# Patient Record
Sex: Female | Born: 1955 | Race: White | Hispanic: No | Marital: Married | Smoking: Former smoker
Health system: Southern US, Community
[De-identification: ages and names within clinical notes are randomized; demographics above are authoritative.]

## PROBLEM LIST (undated history)

## (undated) DIAGNOSIS — H3530 Unspecified macular degeneration: Secondary | ICD-10-CM

## (undated) DIAGNOSIS — C801 Malignant (primary) neoplasm, unspecified: Secondary | ICD-10-CM

## (undated) HISTORY — DX: Malignant (primary) neoplasm, unspecified: C80.1

## (undated) HISTORY — PX: CARPAL TUNNEL RELEASE: SHX101

## (undated) HISTORY — DX: Unspecified macular degeneration: H35.30

---

## 2011-04-07 ENCOUNTER — Ambulatory Visit: Payer: Self-pay | Admitting: General Practice

## 2019-01-30 DIAGNOSIS — R69 Illness, unspecified: Secondary | ICD-10-CM | POA: Diagnosis not present

## 2019-03-20 DIAGNOSIS — R69 Illness, unspecified: Secondary | ICD-10-CM | POA: Diagnosis not present

## 2019-05-02 DIAGNOSIS — H5200 Hypermetropia, unspecified eye: Secondary | ICD-10-CM | POA: Diagnosis not present

## 2019-05-02 DIAGNOSIS — H2513 Age-related nuclear cataract, bilateral: Secondary | ICD-10-CM | POA: Diagnosis not present

## 2019-05-06 DIAGNOSIS — Z0100 Encounter for examination of eyes and vision without abnormal findings: Secondary | ICD-10-CM | POA: Diagnosis not present

## 2019-06-04 DIAGNOSIS — L821 Other seborrheic keratosis: Secondary | ICD-10-CM | POA: Diagnosis not present

## 2019-06-04 DIAGNOSIS — L570 Actinic keratosis: Secondary | ICD-10-CM | POA: Diagnosis not present

## 2019-06-04 DIAGNOSIS — D225 Melanocytic nevi of trunk: Secondary | ICD-10-CM | POA: Diagnosis not present

## 2019-06-04 DIAGNOSIS — L814 Other melanin hyperpigmentation: Secondary | ICD-10-CM | POA: Diagnosis not present

## 2019-06-04 DIAGNOSIS — D485 Neoplasm of uncertain behavior of skin: Secondary | ICD-10-CM | POA: Diagnosis not present

## 2019-06-04 DIAGNOSIS — L905 Scar conditions and fibrosis of skin: Secondary | ICD-10-CM | POA: Diagnosis not present

## 2019-06-04 DIAGNOSIS — Z85828 Personal history of other malignant neoplasm of skin: Secondary | ICD-10-CM | POA: Diagnosis not present

## 2019-06-04 DIAGNOSIS — D22122 Melanocytic nevi of left lower eyelid, including canthus: Secondary | ICD-10-CM | POA: Diagnosis not present

## 2019-08-01 DIAGNOSIS — R69 Illness, unspecified: Secondary | ICD-10-CM | POA: Diagnosis not present

## 2019-09-03 DIAGNOSIS — L810 Postinflammatory hyperpigmentation: Secondary | ICD-10-CM | POA: Diagnosis not present

## 2019-09-03 DIAGNOSIS — L820 Inflamed seborrheic keratosis: Secondary | ICD-10-CM | POA: Diagnosis not present

## 2019-10-02 DIAGNOSIS — Z20822 Contact with and (suspected) exposure to covid-19: Secondary | ICD-10-CM | POA: Diagnosis not present

## 2019-10-02 DIAGNOSIS — Z03818 Encounter for observation for suspected exposure to other biological agents ruled out: Secondary | ICD-10-CM | POA: Diagnosis not present

## 2020-01-04 DIAGNOSIS — L820 Inflamed seborrheic keratosis: Secondary | ICD-10-CM | POA: Diagnosis not present

## 2020-01-04 DIAGNOSIS — D1801 Hemangioma of skin and subcutaneous tissue: Secondary | ICD-10-CM | POA: Diagnosis not present

## 2020-01-04 DIAGNOSIS — L814 Other melanin hyperpigmentation: Secondary | ICD-10-CM | POA: Diagnosis not present

## 2020-01-04 DIAGNOSIS — D229 Melanocytic nevi, unspecified: Secondary | ICD-10-CM | POA: Diagnosis not present

## 2020-01-04 DIAGNOSIS — L821 Other seborrheic keratosis: Secondary | ICD-10-CM | POA: Diagnosis not present

## 2020-01-04 DIAGNOSIS — L918 Other hypertrophic disorders of the skin: Secondary | ICD-10-CM | POA: Diagnosis not present

## 2020-01-04 DIAGNOSIS — D485 Neoplasm of uncertain behavior of skin: Secondary | ICD-10-CM | POA: Diagnosis not present

## 2020-01-04 DIAGNOSIS — D2262 Melanocytic nevi of left upper limb, including shoulder: Secondary | ICD-10-CM | POA: Diagnosis not present

## 2020-02-11 DIAGNOSIS — R69 Illness, unspecified: Secondary | ICD-10-CM | POA: Diagnosis not present

## 2020-09-29 DIAGNOSIS — Z0100 Encounter for examination of eyes and vision without abnormal findings: Secondary | ICD-10-CM | POA: Diagnosis not present

## 2020-09-29 DIAGNOSIS — H5200 Hypermetropia, unspecified eye: Secondary | ICD-10-CM | POA: Diagnosis not present

## 2020-10-07 DIAGNOSIS — H35362 Drusen (degenerative) of macula, left eye: Secondary | ICD-10-CM | POA: Diagnosis not present

## 2020-10-07 DIAGNOSIS — H353211 Exudative age-related macular degeneration, right eye, with active choroidal neovascularization: Secondary | ICD-10-CM | POA: Diagnosis not present

## 2020-10-07 DIAGNOSIS — H2513 Age-related nuclear cataract, bilateral: Secondary | ICD-10-CM | POA: Diagnosis not present

## 2020-10-14 DIAGNOSIS — H353211 Exudative age-related macular degeneration, right eye, with active choroidal neovascularization: Secondary | ICD-10-CM | POA: Diagnosis not present

## 2020-11-11 DIAGNOSIS — H35362 Drusen (degenerative) of macula, left eye: Secondary | ICD-10-CM | POA: Diagnosis not present

## 2020-11-11 DIAGNOSIS — H353211 Exudative age-related macular degeneration, right eye, with active choroidal neovascularization: Secondary | ICD-10-CM | POA: Diagnosis not present

## 2020-12-09 DIAGNOSIS — H35362 Drusen (degenerative) of macula, left eye: Secondary | ICD-10-CM | POA: Diagnosis not present

## 2020-12-09 DIAGNOSIS — H353211 Exudative age-related macular degeneration, right eye, with active choroidal neovascularization: Secondary | ICD-10-CM | POA: Diagnosis not present

## 2021-01-06 DIAGNOSIS — H2513 Age-related nuclear cataract, bilateral: Secondary | ICD-10-CM | POA: Diagnosis not present

## 2021-01-06 DIAGNOSIS — H35362 Drusen (degenerative) of macula, left eye: Secondary | ICD-10-CM | POA: Diagnosis not present

## 2021-01-06 DIAGNOSIS — H35453 Secondary pigmentary degeneration, bilateral: Secondary | ICD-10-CM | POA: Diagnosis not present

## 2021-01-06 DIAGNOSIS — H353211 Exudative age-related macular degeneration, right eye, with active choroidal neovascularization: Secondary | ICD-10-CM | POA: Diagnosis not present

## 2021-01-20 ENCOUNTER — Ambulatory Visit (INDEPENDENT_AMBULATORY_CARE_PROVIDER_SITE_OTHER): Payer: Medicare HMO | Admitting: Family Medicine

## 2021-01-20 ENCOUNTER — Encounter: Payer: Self-pay | Admitting: Family Medicine

## 2021-01-20 ENCOUNTER — Other Ambulatory Visit: Payer: Self-pay

## 2021-01-20 VITALS — BP 130/70 | HR 41 | Temp 97.9°F | Ht 67.0 in | Wt 145.0 lb

## 2021-01-20 DIAGNOSIS — I498 Other specified cardiac arrhythmias: Secondary | ICD-10-CM

## 2021-01-20 DIAGNOSIS — H353211 Exudative age-related macular degeneration, right eye, with active choroidal neovascularization: Secondary | ICD-10-CM | POA: Diagnosis not present

## 2021-01-20 DIAGNOSIS — R6889 Other general symptoms and signs: Secondary | ICD-10-CM | POA: Diagnosis not present

## 2021-01-20 DIAGNOSIS — Z1159 Encounter for screening for other viral diseases: Secondary | ICD-10-CM

## 2021-01-20 DIAGNOSIS — R001 Bradycardia, unspecified: Secondary | ICD-10-CM | POA: Diagnosis not present

## 2021-01-20 DIAGNOSIS — Z789 Other specified health status: Secondary | ICD-10-CM

## 2021-01-20 DIAGNOSIS — I499 Cardiac arrhythmia, unspecified: Secondary | ICD-10-CM | POA: Diagnosis not present

## 2021-01-20 DIAGNOSIS — E559 Vitamin D deficiency, unspecified: Secondary | ICD-10-CM | POA: Diagnosis not present

## 2021-01-20 NOTE — Progress Notes (Signed)
Subjective:  Patient ID: Brittany Matthews, female    DOB: [birth year], 65 y.o.   MRN: [id number]  Patient Care Team: Baruch Gouty, FNP as PCP - General (Family Medicine)   Chief Complaint:  New Patient (Initial Visit)   HPI: Brittany Matthews is a 65 y.o. female presenting on 01/20/2021 for New Patient (Initial Visit)  Pt presents today to establish care with PCP. She has not been seen by PCP in several years. She has a macular degeneration and is followed by opthalmology on a regular basis. She has had skin cancer in the past and does follow with dermatology on a regular basis.   She has not had any preventative health maintenance screenings and declines most today. She would like to have Hep C screening. She declines vaccinations, DEXA scan, mammogram, and colorectal cancer screening.  She is vegan and does take vit B12 once weekly but not on a daily basis.  HR is noted to be irregular in office, declines prior history of this. EHR database incomplete and prior medical records have been completed.   Relevant past medical, surgical, family, and social history reviewed and updated as indicated.  Allergies and medications reviewed and updated. Data reviewed: Chart in Epic.   Past Medical History:  Diagnosis Date   Cancer ([place])    Macular degeneration disease     Past Surgical History:  Procedure Laterality Date   CARPAL TUNNEL RELEASE     CESAREAN SECTION      Social History   Socioeconomic History   Marital status: Married    Spouse name: Not on file   Number of children: Not on file   Years of education: Not on file   Highest education level: Not on file  Occupational History   Not on file  Tobacco Use   Smoking status: Former    Types: Cigarettes   Smokeless tobacco: Never  Substance and Sexual Activity   Alcohol use: Not on file   Drug use: Not on file   Sexual activity: Not on file  Other Topics Concern   Not on file  Social History Narrative   Not on file   Social  Determinants of Health   Financial Resource Strain: Not on file  Food Insecurity: Not on file  Transportation Needs: Not on file  Physical Activity: Not on file  Stress: Not on file  Social Connections: Not on file  Intimate Partner Violence: Not on file    Outpatient Encounter Medications as of 01/20/2021  Medication Sig   Ocular Implant (OCULAR RESERVOIR IZ) by Intravitreal route.   No facility-administered encounter medications on file as of 01/20/2021.    Allergies  Allergen Reactions   Penicillins Other (See Comments)   Sulfa Antibiotics Other (See Comments)    Review of Systems  Constitutional:  Negative for activity change, appetite change, chills, diaphoresis, fatigue, fever and unexpected weight change.  HENT: Negative.    Eyes: Negative.  Negative for photophobia and visual disturbance.  Respiratory:  Negative for cough, chest tightness and shortness of breath.   Cardiovascular:  Negative for chest pain, palpitations and leg swelling.  Gastrointestinal:  Negative for abdominal pain, blood in stool, constipation, diarrhea, nausea and vomiting.  Endocrine: Negative.   Genitourinary:  Negative for decreased urine volume, difficulty urinating, dysuria, frequency and urgency.  Musculoskeletal:  Negative for arthralgias and myalgias.  Skin: Negative.   Allergic/Immunologic: Negative.   Neurological:  Negative for dizziness and headaches.  Hematological: Negative.   Psychiatric/Behavioral:  Negative for confusion, hallucinations, sleep disturbance and suicidal ideas.   All other systems reviewed and are negative.      Objective:  BP 130/70   Pulse (!) 41   Temp 97.9 F (36.6 C)   Ht _0  (1.702 m)   Wt 145 lb (65.8 kg)   SpO2 98%   BMI 22.71 kg/m    Wt Readings from Last 3 Encounters:  01/20/21 145 lb (65.8 kg)    Physical Exam Vitals and nursing note reviewed.  Constitutional:      General: She is not in acute distress.    Appearance: Normal  appearance. She is well-developed, well-groomed and normal weight. She is not ill-appearing, toxic-appearing or diaphoretic.  HENT:     Head: Normocephalic and atraumatic.     Jaw: There is normal jaw occlusion.     Right Ear: Hearing, tympanic membrane, ear canal and external ear normal.     Left Ear: Hearing, tympanic membrane, ear canal and external ear normal.     Nose: Nose normal.     Mouth/Throat:     Lips: Pink.     Mouth: Mucous membranes are moist.     Pharynx: Oropharynx is clear. Uvula midline.  Eyes:     General: Lids are normal.     Extraocular Movements: Extraocular movements intact.     Conjunctiva/sclera: Conjunctivae normal.     Pupils: Pupils are equal, round, and reactive to light.  Neck:     Thyroid: No thyroid mass, thyromegaly or thyroid tenderness.     Vascular: No carotid bruit or JVD.     Trachea: Trachea and phonation normal.  Cardiovascular:     Rate and Rhythm: Normal rate. FrequentExtrasystoles are present.    Chest Wall: PMI is not displaced.     Pulses: Normal pulses.     Heart sounds: Normal heart sounds. No murmur heard.   No friction rub. No gallop.  Pulmonary:     Effort: Pulmonary effort is normal. No respiratory distress.     Breath sounds: Normal breath sounds. No wheezing.  Abdominal:     General: Bowel sounds are normal. There is no distension or abdominal bruit.     Palpations: Abdomen is soft. There is no hepatomegaly or splenomegaly.     Tenderness: There is no abdominal tenderness. There is no right CVA tenderness or left CVA tenderness.     Hernia: No hernia is present.  Musculoskeletal:        General: Normal range of motion.     Cervical back: Normal range of motion and neck supple.     Right lower leg: No edema.     Left lower leg: No edema.  Lymphadenopathy:     Cervical: No cervical adenopathy.  Skin:    General: Skin is warm and dry.     Capillary Refill: Capillary refill takes less than 2 seconds.     Coloration: Skin is  not cyanotic, jaundiced or pale.     Findings: No rash.  Neurological:     General: No focal deficit present.     Mental Status: She is alert and oriented to person, place, and time.     Sensory: Sensation is intact.     Motor: Motor function is intact.     Coordination: Coordination is intact.     Gait: Gait is intact.     Deep Tendon Reflexes: Reflexes are normal and symmetric.  Psychiatric:        Attention and Perception: Attention  and perception normal.        Mood and Affect: Mood and affect normal.        Speech: Speech normal.        Behavior: Behavior normal. Behavior is cooperative.        Thought Content: Thought content normal.        Cognition and Memory: Cognition and memory normal.        Judgment: Judgment normal.     EKG: SR with ventricular bigeminy, 81, PR 132 ms, QT 376, ms, no acute ST-T changes, no prior EKG for comparison. Brittany Pouch, FNP-C  Pertinent labs & imaging results that were available during my care of the patient were reviewed by me and considered in my medical decision making.  Assessment & Plan:  Brittany Matthews was seen today for new patient (initial visit).  Diagnoses and all orders for this visit:  Bradycardia Irregular heart beat Bigeminy EKG in office reveals bigeminy. Will check for potential underlying causes such as anemia, electrolyte imbalances, or thyroid dysfunction. Pt asymptomatic and declined BB therapy today. Will place referral to cardiology.  -     CBC with Differential/Platelet -     CMP14+EGFR -     Thyroid Panel With TSH -     Ambulatory referral to Cardiology  Exudative age-related macular degeneration of right eye with active choroidal neovascularization ([place]) Followed by opthalmology on a regular basis.   Vegan diet Will check for potential vitamin deficiencies, anemia, low protein, and elevated cholesterol levels.  -     CBC with Differential/Platelet -     CMP14+EGFR -     Lipid panel -     Vitamin B12 -     VITAMIN  D 25 Hydroxy (Vit-D Deficiency, Fractures)  Need for hepatitis C screening test -     Hepatitis C antibody    Pt declined all health maintenance in office today other than Hep C screening. Discussed importance of preventative care and pt still declined.   Follow up plan: Return in about 6 months (around 07/20/2021).   Continue healthy lifestyle choices, including diet (rich in fruits, vegetables, and lean proteins, and low in salt and simple carbohydrates) and exercise (at least 30 minutes of moderate physical activity daily).  Educational handout given for health maintenance, PVCs  The above assessment and management plan was discussed with the patient. The patient verbalized understanding of and has agreed to the management plan. Patient is aware to call the clinic if they develop any new symptoms or if symptoms persist or worsen. Patient is aware when to return to the clinic for a follow-up visit. Patient educated on when it is appropriate to go to the emergency department.   Brittany Pouch, FNP-C [facility] [phone]

## 2021-01-21 LAB — THYROID PANEL WITH TSH
Free Thyroxine Index: 2.1 (ref 1.2–4.9)
T3 Uptake Ratio: 27 % (ref 24–39)
T4, Total: 7.6 ug/dL (ref 4.5–12.0)
TSH: 2.25 u[IU]/mL (ref 0.450–4.500)

## 2021-01-21 LAB — CMP14+EGFR
ALT: 12 IU/L (ref 0–32)
AST: 20 IU/L (ref 0–40)
Albumin/Globulin Ratio: 2 (ref 1.2–2.2)
Albumin: 4.3 g/dL (ref 3.8–4.8)
Alkaline Phosphatase: 69 IU/L (ref 44–121)
BUN/Creatinine Ratio: 9 — ABNORMAL LOW (ref 12–28)
BUN: 9 mg/dL (ref 8–27)
Bilirubin Total: 0.4 mg/dL (ref 0.0–1.2)
CO2: 24 mmol/L (ref 20–29)
Calcium: 9.2 mg/dL (ref 8.7–10.3)
Chloride: 103 mmol/L (ref 96–106)
Creatinine, Ser: 0.97 mg/dL (ref 0.57–1.00)
Globulin, Total: 2.2 g/dL (ref 1.5–4.5)
Glucose: 91 mg/dL (ref 70–99)
Potassium: 4.1 mmol/L (ref 3.5–5.2)
Sodium: 140 mmol/L (ref 134–144)
Total Protein: 6.5 g/dL (ref 6.0–8.5)
eGFR: 64 mL/min/{1.73_m2} (ref >59)

## 2021-01-21 LAB — CBC WITH DIFFERENTIAL/PLATELET
Basophils Absolute: 0 10*3/uL (ref 0.0–0.2)
Basos: 1 %
EOS (ABSOLUTE): 0.1 10*3/uL (ref 0.0–0.4)
Eos: 2 %
Hematocrit: 40.2 % (ref 34.0–46.6)
Hemoglobin: 13.4 g/dL (ref 11.1–15.9)
Immature Grans (Abs): 0 10*3/uL (ref 0.0–0.1)
Immature Granulocytes: 0 %
Lymphocytes Absolute: 1.9 10*3/uL (ref 0.7–3.1)
Lymphs: 35 %
MCH: 29.9 pg (ref 26.6–33.0)
MCHC: 33.3 g/dL (ref 31.5–35.7)
MCV: 90 fL (ref 79–97)
Monocytes Absolute: 0.4 10*3/uL (ref 0.1–0.9)
Monocytes: 7 %
Neutrophils Absolute: 3 10*3/uL (ref 1.4–7.0)
Neutrophils: 55 %
Platelets: 286 10*3/uL (ref 150–450)
RBC: 4.48 x10E6/uL (ref 3.77–5.28)
RDW: 11.8 % (ref 11.7–15.4)
WBC: 5.4 10*3/uL (ref 3.4–10.8)

## 2021-01-21 LAB — LIPID PANEL
Chol/HDL Ratio: 3.9 ratio (ref 0.0–4.4)
Cholesterol, Total: 242 mg/dL — ABNORMAL HIGH (ref 100–199)
HDL: 62 mg/dL (ref >39)
LDL Chol Calc (NIH): 159 mg/dL — ABNORMAL HIGH (ref 0–99)
Triglycerides: 121 mg/dL (ref 0–149)
VLDL Cholesterol Cal: 21 mg/dL (ref 5–40)

## 2021-01-21 LAB — VITAMIN B12: Vitamin B-12: 752 pg/mL (ref 232–1245)

## 2021-01-21 LAB — HEPATITIS C ANTIBODY: Hep C Virus Ab: <0.1 s/co ratio (ref 0.0–0.9)

## 2021-01-21 LAB — VITAMIN D 25 HYDROXY (VIT D DEFICIENCY, FRACTURES): Vit D, 25-Hydroxy: 14.6 ng/mL — ABNORMAL LOW (ref 30.0–100.0)

## 2021-01-21 MED ORDER — VITAMIN D (ERGOCALCIFEROL) 1.25 MG (50000 UNIT) PO CAPS: 50000.0000 [IU] | ORAL_CAPSULE | ORAL | 2 refills | Status: DC

## 2021-01-21 NOTE — Addendum Note (Signed)
Addended by: Baruch Gouty on: 01/21/2021 01:45 PM   Modules accepted: Orders

## 2021-01-27 DIAGNOSIS — R001 Bradycardia, unspecified: Secondary | ICD-10-CM | POA: Insufficient documentation

## 2021-01-27 NOTE — Progress Notes (Signed)
Cardiology Office Note   Date:  01/28/2021   ID:  Brittany Matthews, DOB 1955-07-15, MRN [id number]  PCP:  Baruch Gouty, FNP  Cardiologist:   None Referring:  Baruch Gouty, FNP  Chief Complaint  Patient presents with   Chest Pain       History of Present Illness: Brittany Matthews is a 65 y.o. female who is referred by Baruch Gouty, FNP for evaluation of arrhythmia.  She has frequent PVCs on EKG.  She has not had any prior cardiac history. The patient denies any new symptoms such as chest discomfort, neck or arm discomfort. There has been no new shortness of breath, PND or orthopnea. There have been no reported palpitations, presyncope or syncope.  She has had no prior cardiac testing.  She eats a very healthy plant-based diet and is vegan.  She exercises routinely walking her dog and is walking in general.  With this she denies any cardiovascular symptoms.   Past Medical History:  Diagnosis Date   Cancer ([place])    Skin   Macular degeneration disease     Past Surgical History:  Procedure Laterality Date   CARPAL TUNNEL RELEASE     CESAREAN SECTION       Current Outpatient Medications  Medication Sig Dispense Refill   Cyanocobalamin (B-12 PO) Take by mouth once a week.     Multiple Vitamins-Minerals (ICAPS AREDS FORMULA PO) Take by mouth 2 (two) times daily.     Niacin (VITAMIN B-3 PO) Take by mouth 2 (two) times daily.     Ocular Implant (OCULAR RESERVOIR IZ) by Intravitreal route.     Vitamin D, Ergocalciferol, (DRISDOL) 1.25 MG (50000 UNIT) CAPS capsule Take 1 capsule (50,000 Units total) by mouth every 7 (seven) days. (Patient not taking: Reported on 01/28/2021) 5 capsule 2   No current facility-administered medications for this visit.    Allergies:   Penicillins and Sulfa antibiotics    Social History:  The patient  reports that she has quit smoking. Her smoking use included cigarettes. She has never used smokeless tobacco.   Family History:  The patient's family  history includes COPD in her mother; Parkinsonism in her father.    ROS:  Please see the history of present illness.   Otherwise, review of systems are positive for none.   All other systems are reviewed and negative.    PHYSICAL EXAM: VS:  BP 100/70   Pulse 80   Ht 5\' 7"  (1.702 m)   Wt 146 lb (66.2 kg)   BMI 22.87 kg/m  , BMI Body mass index is 22.87 kg/m. GENERAL:  Well appearing HEENT:  Pupils equal round and reactive, fundi not visualized, oral mucosa unremarkable NECK:  No jugular venous distention, waveform within normal limits, carotid upstroke brisk and symmetric, no bruits, no thyromegaly LYMPHATICS:  No cervical, inguinal adenopathy LUNGS:  Clear to auscultation bilaterally BACK:  No CVA tenderness CHEST:  Unremarkable HEART:  PMI not displaced or sustained,S1 and S2 within normal limits, no S3, no S4, no clicks, no rubs, no murmurs ABD:  Flat, positive bowel sounds normal in frequency in pitch, no bruits, no rebound, no guarding, no midline pulsatile mass, no hepatomegaly, no splenomegaly EXT:  2 plus pulses throughout, no edema, no cyanosis no clubbing SKIN:  No rashes no nodules NEURO:  Cranial nerves II through XII grossly intact, motor grossly intact throughout PSYCH:  Cognitively intact, oriented to person place and time    EKG:  EKG  is not ordered today. The ekg ordered 01/20/2021 demonstrates sinus rhythm, premature ventricular contractions in a bigeminal pattern, poor anterior R wave progression, short PR interval, rightward axis, no acute ST-T wave changes.   Recent Labs: 01/20/2021: ALT 12; BUN 9; Creatinine, Ser 0.97; Hemoglobin 13.4; Platelets 286; Potassium 4.1; Sodium 140; TSH 2.250    Lipid Panel    Component Value Date/Time   CHOL 242 (H) 01/20/2021 0942   TRIG 121 01/20/2021 0942   HDL 62 01/20/2021 0942   CHOLHDL 3.9 01/20/2021 0942   LDLCALC 159 (H) 01/20/2021 0942      Wt Readings from Last 3 Encounters:  01/28/21 146 lb (66.2 kg)   01/20/21 145 lb (65.8 kg)      Other studies Reviewed: Additional studies/ records that were reviewed today include: Labs. Review of the above records demonstrates:  Please see elsewhere in the note.     ASSESSMENT AND PLAN:  BRADYCARDIA: She was reported to have bradycardia and documented to have PVCs.  I explained that her lower heart rate that might be recorded at times is probably related to ventricular bigeminy.  To evaluate her asymptomatic ectopy I am going to check an echocardiogram and a 3-day Zio patch.  Labs have been unremarkable to include her electrolytes and TSH.  She likely will need further evaluation and treatment since she is asymptomatic.  DYSLIPIDEMIA:  We reviewed this.  Her LDL is 159.  I think is very prudent to do a coronary calcium score to determine goals of therapy.  She would be reluctant to take a statin but might consider it pending these results.   Current medicines are reviewed at length with the patient today.  The patient does not have concerns regarding medicines.  The following changes have been made:  no change  Labs/ tests ordered today include:   Orders Placed This Encounter  Procedures   CT CARDIAC SCORING (SELF PAY ONLY)   LONG TERM MONITOR (3-14 DAYS)   ECHOCARDIOGRAM COMPLETE      Disposition:   FU with me as needed based on results of the above.   Signed, Minus Breeding, MD  01/28/2021 4:06 PM     [facility]

## 2021-01-28 ENCOUNTER — Ambulatory Visit (INDEPENDENT_AMBULATORY_CARE_PROVIDER_SITE_OTHER): Payer: Medicare HMO | Admitting: Cardiology

## 2021-01-28 ENCOUNTER — Other Ambulatory Visit: Payer: Self-pay

## 2021-01-28 ENCOUNTER — Ambulatory Visit (INDEPENDENT_AMBULATORY_CARE_PROVIDER_SITE_OTHER): Payer: Medicare HMO

## 2021-01-28 ENCOUNTER — Encounter: Payer: Self-pay | Admitting: Cardiology

## 2021-01-28 VITALS — BP 100/70 | HR 80 | Ht 67.0 in | Wt 146.0 lb

## 2021-01-28 DIAGNOSIS — I493 Ventricular premature depolarization: Secondary | ICD-10-CM

## 2021-01-28 DIAGNOSIS — R001 Bradycardia, unspecified: Secondary | ICD-10-CM | POA: Diagnosis not present

## 2021-01-28 NOTE — Progress Notes (Unsigned)
Patient enrolled for [employer] to mail a 3 day ZIO XT monitor to her address on file

## 2021-01-28 NOTE — Patient Instructions (Signed)
Medication Instructions:  The current medical regimen is effective;  continue present plan and medications.  *If you need a refill on your cardiac medications before your next appointment, please call your pharmacy*   Testing/Procedures: Your physician has requested that you have an echocardiogram. Echocardiography is a painless test that uses sound waves to create images of your heart. It provides your doctor with information about the size and shape of your heart and how well your heart's chambers and valves are working. This procedure takes approximately one hour. There are no restrictions for this procedure.  Your physician has requested that you have a Coronary Calcium score which is completed by CT. Cardiac computed tomography (CT) is a painless test that uses an x-ray machine to take clear, detailed pictures of your heart. There are no instructions for this testing.  You may eat/drink and take your normal medications this day.  The cost of the testing is $99 due at the time of your appointment.  ZIO XT- Long Term Monitor Instructions  Your physician has requested you wear a ZIO patch monitor for 3 days.  This is a single patch monitor. [employer] supplies one patch monitor per enrollment. Additional stickers are not available. Please do not apply patch if you will be having a Nuclear Stress Test,  Echocardiogram, Cardiac CT, MRI, or Chest Xray during the period you would be wearing the  monitor. The patch cannot be worn during these tests. You cannot remove and re-apply the  ZIO XT patch monitor.  Your ZIO patch monitor will be mailed 3 day USPS to your address on file. It may take 3-5 days  to receive your monitor after you have been enrolled.  Once you have received your monitor, please review the enclosed instructions. Your monitor  has already been registered assigning a specific monitor serial # to you.  Billing and Patient Assistance Program Information  We have supplied [employer]  with any of your insurance information on file for billing purposes. [employer] offers a sliding scale Patient Assistance Program for patients that do not have  insurance, or whose insurance does not completely cover the cost of the ZIO monitor.  You must apply for the Patient Assistance Program to qualify for this discounted rate.  To apply, please call [employer] at [phone], select option 4, select option 2, ask to apply for  Patient Assistance Program. Brittany Matthews will ask your household income, and how many people  are in your household. They will quote your out-of-pocket cost based on that information.  [employer] will also be able to set up a 33-month, interest-free payment plan if needed.  Applying the monitor   Shave hair from upper left chest.  Hold abrader disc by orange tab. Rub abrader in 40 strokes over the upper left chest as  indicated in your monitor instructions.  Clean area with 4 enclosed alcohol pads. Let dry.  Apply patch as indicated in monitor instructions. Patch will be placed under collarbone on left  side of chest with arrow pointing upward.  Rub patch adhesive wings for 2 minutes. Remove white label marked "1". Remove the white  label marked "2". Rub patch adhesive wings for 2 additional minutes.  While looking in a mirror, press and release button in center of patch. A small green light will  flash 3-4 times. This will be your only indicator that the monitor has been turned on.  Do not shower for the first 24 hours. You may shower after the first 24 hours.  Press the button if you feel a symptom. You will hear a small click. Record Date, Time and  Symptom in the Patient Logbook.  When you are ready to remove the patch, follow instructions on the last 2 pages of Patient  Logbook. Stick patch monitor onto the last page of Patient Logbook.  Place Patient Logbook in the blue and white box. Use locking tab on box and tape box closed  securely. The blue and white box has  prepaid postage on it. Please place it in the mailbox as  soon as possible. Your physician should have your test results approximately 7 days after the  monitor has been mailed back to [facility].  Call [facility] at [phone] if you have questions regarding  your ZIO XT patch monitor. Call them immediately if you see an orange light blinking on your  monitor.  If your monitor falls off in less than 4 days, contact our Monitor department at [phone].  If your monitor becomes loose or falls off after 4 days call [employer] at [phone] for  suggestions on securing your monitor    Follow-Up: At [facility], you and your health needs are our priority.  As part of our continuing mission to provide you with exceptional heart care, we have created designated Provider Care Teams.  These Care Teams include your primary Cardiologist (physician) and Advanced Practice Providers (APPs -  Physician Assistants and Nurse Practitioners) who all work together to provide you with the care you need, when you need it.  We recommend signing up for the patient portal called "MyChart".  Sign up information is provided on this After Visit Summary.  MyChart is used to connect with patients for Virtual Visits (Telemedicine).  Patients are able to view lab/test results, encounter notes, upcoming appointments, etc.  Non-urgent messages can be sent to your provider as well.   To learn more about what you can do with MyChart, go to [email or web address].    Your next appointment:   Follow up will be based on the above results.  Thank you for choosing [facility]!!

## 2021-01-30 DIAGNOSIS — R001 Bradycardia, unspecified: Secondary | ICD-10-CM

## 2021-01-30 DIAGNOSIS — I493 Ventricular premature depolarization: Secondary | ICD-10-CM | POA: Diagnosis not present

## 2021-02-04 ENCOUNTER — Telehealth: Payer: Self-pay | Admitting: Family Medicine

## 2021-02-04 DIAGNOSIS — E559 Vitamin D deficiency, unspecified: Secondary | ICD-10-CM

## 2021-02-04 DIAGNOSIS — Z789 Other specified health status: Secondary | ICD-10-CM

## 2021-02-04 MED ORDER — VITAMIN D (ERGOCALCIFEROL) 1.25 MG (50000 UNIT) PO CAPS: 50000.0000 [IU] | ORAL_CAPSULE | ORAL | 2 refills | Status: DC

## 2021-02-04 NOTE — Telephone Encounter (Signed)
Rx sent to [facility] and patient notified

## 2021-02-09 DIAGNOSIS — I493 Ventricular premature depolarization: Secondary | ICD-10-CM | POA: Diagnosis not present

## 2021-02-09 DIAGNOSIS — R001 Bradycardia, unspecified: Secondary | ICD-10-CM | POA: Diagnosis not present

## 2021-02-16 ENCOUNTER — Ambulatory Visit (HOSPITAL_COMMUNITY): Payer: Medicare HMO | Attending: Cardiovascular Disease

## 2021-02-16 ENCOUNTER — Ambulatory Visit (INDEPENDENT_AMBULATORY_CARE_PROVIDER_SITE_OTHER)
Admission: RE | Admit: 2021-02-16 | Discharge: 2021-02-16 | Disposition: A | Discharge status: Home / Self Care | Payer: Self-pay | Source: Ambulatory Visit | Attending: Cardiology | Admitting: Cardiology

## 2021-02-16 ENCOUNTER — Other Ambulatory Visit: Payer: Self-pay

## 2021-02-16 DIAGNOSIS — I493 Ventricular premature depolarization: Secondary | ICD-10-CM | POA: Diagnosis not present

## 2021-02-16 DIAGNOSIS — R001 Bradycardia, unspecified: Secondary | ICD-10-CM | POA: Insufficient documentation

## 2021-02-16 LAB — ECHOCARDIOGRAM COMPLETE
Area-P 1/2: 4.21 cm2
S' Lateral: 2.9 cm

## 2021-02-17 DIAGNOSIS — H35362 Drusen (degenerative) of macula, left eye: Secondary | ICD-10-CM | POA: Diagnosis not present

## 2021-02-17 DIAGNOSIS — H353211 Exudative age-related macular degeneration, right eye, with active choroidal neovascularization: Secondary | ICD-10-CM | POA: Diagnosis not present

## 2021-02-23 ENCOUNTER — Other Ambulatory Visit: Payer: Self-pay | Admitting: *Deleted

## 2021-02-23 DIAGNOSIS — I700 Atherosclerosis of aorta: Secondary | ICD-10-CM

## 2021-02-23 DIAGNOSIS — I3139 Other pericardial effusion (noninflammatory): Secondary | ICD-10-CM

## 2021-02-26 ENCOUNTER — Ambulatory Visit: Payer: Self-pay | Admitting: Cardiology

## 2021-03-03 ENCOUNTER — Ambulatory Visit (HOSPITAL_COMMUNITY)
Admission: RE | Admit: 2021-03-03 | Discharge: 2021-03-03 | Disposition: A | Discharge status: Home / Self Care | Payer: Medicare HMO | Source: Ambulatory Visit | Attending: Family Medicine | Admitting: Family Medicine

## 2021-03-03 ENCOUNTER — Ambulatory Visit (INDEPENDENT_AMBULATORY_CARE_PROVIDER_SITE_OTHER): Payer: Medicare HMO | Admitting: Family Medicine

## 2021-03-03 ENCOUNTER — Encounter: Payer: Self-pay | Admitting: Family Medicine

## 2021-03-03 ENCOUNTER — Other Ambulatory Visit: Payer: Self-pay

## 2021-03-03 VITALS — BP 129/71 | HR 112 | Temp 97.1°F | Ht 67.0 in | Wt 143.6 lb

## 2021-03-03 DIAGNOSIS — R55 Syncope and collapse: Secondary | ICD-10-CM | POA: Diagnosis not present

## 2021-03-03 NOTE — Progress Notes (Signed)
Assessment & Plan:  1. Syncope and collapse Heart rate increased by 47 from lying to standing. Discussed with patient that she needs immediate medical care for syncopal episodes and not to wait days before seeking care in the future. Lab work and head CT ordered to see if we can identify why she passed out and rule out stroke. Strongly encouraged her to call her cardiologist and get an appointment ASAP as I feel this was more than likely cardiac related.  - CBC with Differential/Platelet - CMP14+EGFR - TSH - CT HEAD WO CONTRAST (5MM); Future   Follow up plan: Return if symptoms worsen or fail to improve.  Hendricks Limes, MSN, APRN, FNP-C [facility]  Subjective:   Patient ID: Brittany Matthews, female    DOB: [birth year], 65 y.o.   MRN: [id number]  HPI: Brittany Matthews is a 65 y.o. female presenting on 03/03/2021 for passed out (Patient states that she passed out 3 times on Sunday. )  Patient reports she passed out three times on Sunday (two days ago). Reports she got up to go make some coffee and was standing at the coffee machine when she hit the floor. Denies any symptoms prior to passing out. She reports that while she was on the floor she could hear her husband, her mind was telling her to get up, but she couldn't see anything. She is unsure if she would have been able to speak as she didn't try. Her husband was able to get her up and then she went down again; this then happened again. Patient reports this has occurred two other times in the past 12 years, but this time was the worst. She also reports dizziness upon standing for years that is worse in the summer months, but confirmed again that she was not dizzy prior to passing out. She does have a cardiologist that she sees for bradycardia, PVCs, and ventricular bigeminy. Patient states she is unable to feel her heart beating slowly or fast and she does not feel it skipping or being irregular.   The 10-year ASCVD risk score  (Arnett DK, et al., 2019) is: 7.3%   Values used to calculate the score:     Age: 41 years     Sex: Female     Is Non-Hispanic African American: No     Diabetic: No     Tobacco smoker: No     Systolic Blood Pressure: 458 mmHg     Is BP treated: No     HDL Cholesterol: 62 mg/dL     Total Cholesterol: 242 mg/dL   ROS: Negative unless specifically indicated above in HPI.   Relevant past medical history reviewed and updated as indicated.   Allergies and medications reviewed and updated.   Current Outpatient Medications:    Cyanocobalamin (B-12 PO), Take by mouth once a week., Disp: , Rfl:    Multiple Vitamins-Minerals (ICAPS AREDS FORMULA PO), Take by mouth 2 (two) times daily., Disp: , Rfl:    Niacin (VITAMIN B-3 PO), Take by mouth 2 (two) times daily., Disp: , Rfl:    Ocular Implant (OCULAR RESERVOIR IZ), by Intravitreal route., Disp: , Rfl:    Vitamin D, Ergocalciferol, (DRISDOL) 1.25 MG (50000 UNIT) CAPS capsule, Take 1 capsule (50,000 Units total) by mouth every 7 (seven) days., Disp: 5 capsule, Rfl: 2  Allergies  Allergen Reactions   Penicillins Other (See Comments)   Sulfa Antibiotics Other (See Comments)    Objective:   BP 129/71  Pulse (!) 112    Temp (!) 97.1 F (36.2 C) (Temporal)    Ht _0  (1.702 m)    Wt 143 lb 9.6 oz (65.1 kg)    SpO2 98%    BMI 22.49 kg/m    Orthostatics Lying  BP 118/70 HR 51 Sitting  BP 129/79 HR 54 Standing BP 124/75 HR 98   Physical Exam Vitals reviewed.  Constitutional:      General: She is not in acute distress.    Appearance: Normal appearance. She is normal weight. She is not ill-appearing, toxic-appearing or diaphoretic.  HENT:     Head: Normocephalic and atraumatic.     Right Ear: Tympanic membrane, ear canal and external ear normal. There is no impacted cerumen.     Left Ear: Tympanic membrane, ear canal and external ear normal. There is no impacted cerumen.  Eyes:     General: No scleral icterus.       Right eye: No  discharge.        Left eye: No discharge.     Conjunctiva/sclera: Conjunctivae normal.  Cardiovascular:     Rate and Rhythm: Tachycardia present. Rhythm irregular.     Heart sounds: Normal heart sounds. No murmur heard.   No friction rub. No gallop.  Pulmonary:     Effort: Pulmonary effort is normal. No respiratory distress.     Breath sounds: Normal breath sounds. No stridor. No wheezing, rhonchi or rales.  Musculoskeletal:        General: Normal range of motion.     Cervical back: Normal range of motion.  Skin:    General: Skin is warm and dry.     Capillary Refill: Capillary refill takes less than 2 seconds.  Neurological:     General: No focal deficit present.     Mental Status: She is alert and oriented to person, place, and time. Mental status is at baseline.  Psychiatric:        Mood and Affect: Mood normal.        Behavior: Behavior normal.        Thought Content: Thought content normal.        Judgment: Judgment normal.

## 2021-03-03 NOTE — Patient Instructions (Signed)
Reschedule appointment with cardiology ASAP.

## 2021-03-04 LAB — CMP14+EGFR
ALT: 13 IU/L (ref 0–32)
AST: 17 IU/L (ref 0–40)
Albumin/Globulin Ratio: 2.7 — ABNORMAL HIGH (ref 1.2–2.2)
Albumin: 4.8 g/dL (ref 3.8–4.8)
Alkaline Phosphatase: 82 IU/L (ref 44–121)
BUN/Creatinine Ratio: 13 (ref 12–28)
BUN: 11 mg/dL (ref 8–27)
Bilirubin Total: 0.5 mg/dL (ref 0.0–1.2)
CO2: 23 mmol/L (ref 20–29)
Calcium: 9.1 mg/dL (ref 8.7–10.3)
Chloride: 99 mmol/L (ref 96–106)
Creatinine, Ser: 0.88 mg/dL (ref 0.57–1.00)
Globulin, Total: 1.8 g/dL (ref 1.5–4.5)
Glucose: 100 mg/dL — ABNORMAL HIGH (ref 70–99)
Potassium: 4.1 mmol/L (ref 3.5–5.2)
Sodium: 137 mmol/L (ref 134–144)
Total Protein: 6.6 g/dL (ref 6.0–8.5)
eGFR: 72 mL/min/{1.73_m2} (ref >59)

## 2021-03-04 LAB — CBC WITH DIFFERENTIAL/PLATELET
Basophils Absolute: 0 10*3/uL (ref 0.0–0.2)
Basos: 0 %
EOS (ABSOLUTE): 0 10*3/uL (ref 0.0–0.4)
Eos: 1 %
Hematocrit: 40.4 % (ref 34.0–46.6)
Hemoglobin: 13.6 g/dL (ref 11.1–15.9)
Immature Grans (Abs): 0 10*3/uL (ref 0.0–0.1)
Immature Granulocytes: 0 %
Lymphocytes Absolute: 1.6 10*3/uL (ref 0.7–3.1)
Lymphs: 23 %
MCH: 29.8 pg (ref 26.6–33.0)
MCHC: 33.7 g/dL (ref 31.5–35.7)
MCV: 89 fL (ref 79–97)
Monocytes Absolute: 0.4 10*3/uL (ref 0.1–0.9)
Monocytes: 5 %
Neutrophils Absolute: 5 10*3/uL (ref 1.4–7.0)
Neutrophils: 71 %
Platelets: 270 10*3/uL (ref 150–450)
RBC: 4.56 x10E6/uL (ref 3.77–5.28)
RDW: 11.9 % (ref 11.7–15.4)
WBC: 7.1 10*3/uL (ref 3.4–10.8)

## 2021-03-04 LAB — TSH: TSH: 3.69 u[IU]/mL (ref 0.450–4.500)

## 2021-03-18 DIAGNOSIS — R4020 Unspecified coma: Secondary | ICD-10-CM | POA: Insufficient documentation

## 2021-03-18 DIAGNOSIS — E785 Hyperlipidemia, unspecified: Secondary | ICD-10-CM | POA: Insufficient documentation

## 2021-03-18 DIAGNOSIS — I3139 Other pericardial effusion (noninflammatory): Secondary | ICD-10-CM | POA: Insufficient documentation

## 2021-03-18 NOTE — Progress Notes (Signed)
Cardiology Office Note   Date:  03/19/2021   ID:  Brittany Matthews, DOB Sep 15, 1955, MRN [id number]  PCP:  Baruch Gouty, FNP  Cardiologist:   Minus Breeding, MD Referring:  Baruch Gouty, FNP  Chief Complaint  Patient presents with   Loss of Consciousness    History of Present Illness: Brittany Matthews is a 66 y.o. female who was referred by Baruch Gouty, FNP for evaluation of arrhythmia. She had PVCs and I sent her for an echo.  She had a small pericardial effusion.  Monitor demonstrated ventricular bigeminy.   She saw her PCP in late Dec reporting that she had syncope 3 times.    She describes an event occurred after she had gotten up and she had been walking around.  She had no warning and she went down to the ground.  She is not sure that she completely lost consciousness.  However, she had her eyes open and she could not see anything.  Her husband said that she was unresponsive.  He tried to get her up.  She went down again.  He tried to get up with her son when she went down the third time.  Subsequently she felt better and she was seen in the has not had any further presyncope.  She denies any chest pressure, neck or arm discomfort.  She has no she has had some mild orthostatic symptoms but these have been longstanding.  She was not orthostatic in the hospital today.   Past Medical History:  Diagnosis Date   Cancer ([place])    Skin   Macular degeneration disease     Past Surgical History:  Procedure Laterality Date   CARPAL TUNNEL RELEASE     CESAREAN SECTION       Current Outpatient Medications  Medication Sig Dispense Refill   Cyanocobalamin (B-12 PO) Take by mouth once a week.     Multiple Vitamins-Minerals (ICAPS AREDS FORMULA PO) Take by mouth 2 (two) times daily.     Niacin (VITAMIN B-3 PO) Take by mouth 2 (two) times daily.     Ocular Implant (OCULAR RESERVOIR IZ) by Intravitreal route.     Vitamin D, Ergocalciferol, (DRISDOL) 1.25 MG (50000 UNIT) CAPS capsule Take 1  capsule (50,000 Units total) by mouth every 7 (seven) days. (Patient not taking: Reported on 03/19/2021) 5 capsule 2   No current facility-administered medications for this visit.    Allergies:   Penicillins and Sulfa antibiotics    ROS:  Please see the history of present illness.   Otherwise, review of systems are positive for none.   All other systems are reviewed and negative.    PHYSICAL EXAM: VS:  BP 135/74 (BP Location: Right Arm, Patient Position: Standing, Cuff Size: Normal)    Pulse 79    SpO2 100%  , BMI There is no height or weight on file to calculate BMI. GENERAL:  Well appearing NECK:  No jugular venous distention, waveform within normal limits, carotid upstroke brisk and symmetric, no bruits, no thyromegaly LUNGS:  Clear to auscultation bilaterally CHEST:  Unremarkable HEART:  PMI not displaced or sustained,S1 and S2 within normal limits, no S3, no S4, no clicks, no rubs, no murmurs ABD:  Flat, positive bowel sounds normal in frequency in pitch, no bruits, no rebound, no guarding, no midline pulsatile mass, no hepatomegaly, no splenomegaly EXT:  2 plus pulses throughout, no edema, no cyanosis no clubbing     EKG:  EKG is not ordered  today. The ekg ordered 01/20/2021 demonstrates sinus rhythm, normal sinus rhythm, rate 81, premature ventricular contractions in a bigeminal pattern.   Recent Labs: 03/03/2021: ALT 13; BUN 11; Creatinine, Ser 0.88; Hemoglobin 13.6; Platelets 270; Potassium 4.1; Sodium 137; TSH 3.690    Lipid Panel    Component Value Date/Time   CHOL 242 (H) 01/20/2021 0942   TRIG 121 01/20/2021 0942   HDL 62 01/20/2021 0942   CHOLHDL 3.9 01/20/2021 0942   LDLCALC 159 (H) 01/20/2021 0942      Wt Readings from Last 3 Encounters:  03/03/21 143 lb 9.6 oz (65.1 kg)  01/28/21 146 lb (66.2 kg)  01/20/21 145 lb (65.8 kg)      Other studies Reviewed: Additional studies/ records that were reviewed today include: Office records. Review of the above  records demonstrates:  Please see elsewhere in the note.     ASSESSMENT AND PLAN:   SYNCOPE: She was not orthostatic in the office today.  She is actually in bigeminy by exam but she is not feeling this.  This is not the cause of her syncope clearly but I still think very much that this could be an arrhythmic event.  I am going to see if I can induce any dysrhythmias factoring in her back for a POET (Plain Old Exercise Treadmill).  Likely because of the infrequency of these events she is going to need an implanted monitor.  PERICARDIAL EFFUSION: I will check an echocardiogram in about 3 months.  DYSLIPIDEMIA: She had no coronary calcium.  No change in therapy.  She will have medical management.    Current medicines are reviewed at length with the patient today.  The patient does not have concerns regarding medicines.  The following changes have been made:  no change  Labs/ tests ordered today include:   Orders Placed This Encounter  Procedures   EXERCISE TOLERANCE TEST (ETT)      Disposition:   FU with me after the POET (Plain Old Exercise Treadmill)   Signed, Minus Breeding, MD  03/19/2021 5:23 PM    [place]

## 2021-03-19 ENCOUNTER — Ambulatory Visit: Payer: Medicare HMO | Admitting: Cardiology

## 2021-03-19 ENCOUNTER — Encounter: Payer: Self-pay | Admitting: Cardiology

## 2021-03-19 ENCOUNTER — Other Ambulatory Visit: Payer: Self-pay

## 2021-03-19 VITALS — BP 135/74 | HR 79

## 2021-03-19 DIAGNOSIS — I3139 Other pericardial effusion (noninflammatory): Secondary | ICD-10-CM

## 2021-03-19 DIAGNOSIS — E785 Hyperlipidemia, unspecified: Secondary | ICD-10-CM | POA: Diagnosis not present

## 2021-03-19 DIAGNOSIS — R55 Syncope and collapse: Secondary | ICD-10-CM

## 2021-03-19 DIAGNOSIS — R4020 Unspecified coma: Secondary | ICD-10-CM | POA: Diagnosis not present

## 2021-03-19 NOTE — Patient Instructions (Signed)
Medication Instructions:  Your physician recommends that you continue on your current medications as directed. Please refer to the Current Medication list given to you today.  *If you need a refill on your cardiac medications before your next appointment, please call your pharmacy*  Lab Work: NONE ordered at this time of appointment   If you have labs (blood work) drawn today and your tests are completely normal, you will receive your results only by: [place] (if you have MyChart) OR A paper copy in the mail If you have any lab test that is abnormal or we need to change your treatment, we will call you to review the results.  Testing/Procedures: Your physician has requested that you have an exercise tolerance test. For further information please visit [email or web address]. Please also follow instruction sheet, as given.  Please schedule for 1-2 weeks   Follow-Up: At [facility], you and your health needs are our priority.  As part of our continuing mission to provide you with exceptional heart care, we have created designated Provider Care Teams.  These Care Teams include your primary Cardiologist (physician) and Advanced Practice Providers (APPs -  Physician Assistants and Nurse Practitioners) who all work together to provide you with the care you need, when you need it.  We recommend signing up for the patient portal called "MyChart".  Sign up information is provided on this After Visit Summary.  MyChart is used to connect with patients for Virtual Visits (Telemedicine).  Patients are able to view lab/test results, encounter notes, upcoming appointments, etc.  Non-urgent messages can be sent to your provider as well.   To learn more about what you can do with MyChart, go to [email or web address].    Your next appointment:   2 month(s)  The format for your next appointment:   In Person  Provider:   Minus Breeding, MD    Other Instructions

## 2021-03-20 ENCOUNTER — Telehealth: Payer: Self-pay

## 2021-03-20 ENCOUNTER — Telehealth (HOSPITAL_COMMUNITY): Payer: Self-pay | Admitting: *Deleted

## 2021-03-20 NOTE — Telephone Encounter (Signed)
Spoke to patient treadmill ( POET ) scheduled 03/25/21 at 9:45 am at [facility] office.Instructions reviewed.Patient voice understanding.

## 2021-03-20 NOTE — Telephone Encounter (Signed)
Close encounter 

## 2021-03-24 ENCOUNTER — Other Ambulatory Visit: Payer: Self-pay | Admitting: *Deleted

## 2021-03-24 DIAGNOSIS — R55 Syncope and collapse: Secondary | ICD-10-CM

## 2021-03-25 ENCOUNTER — Ambulatory Visit (HOSPITAL_COMMUNITY)
Admission: RE | Admit: 2021-03-25 | Discharge: 2021-03-25 | Disposition: A | Discharge status: Home / Self Care | Payer: Medicare HMO | Source: Ambulatory Visit | Attending: Internal Medicine | Admitting: Internal Medicine

## 2021-03-25 ENCOUNTER — Other Ambulatory Visit: Payer: Self-pay

## 2021-03-25 DIAGNOSIS — R4020 Unspecified coma: Secondary | ICD-10-CM | POA: Diagnosis not present

## 2021-03-25 DIAGNOSIS — R55 Syncope and collapse: Secondary | ICD-10-CM | POA: Insufficient documentation

## 2021-03-25 LAB — EXERCISE TOLERANCE TEST
Angina Index: 0
Duke Treadmill Score: 11
Estimated workload: 13.4
Exercise duration (min): 11 min
Exercise duration (sec): 1 s
MPHR: 153 {beats}/min
Peak HR: 173 {beats}/min
Percent HR: 113 %
Rest HR: 89 {beats}/min
ST Depression (mm): 0 mm

## 2021-04-03 ENCOUNTER — Ambulatory Visit: Payer: Medicare HMO | Admitting: Cardiology

## 2021-04-14 DIAGNOSIS — H35362 Drusen (degenerative) of macula, left eye: Secondary | ICD-10-CM | POA: Diagnosis not present

## 2021-04-14 DIAGNOSIS — H353211 Exudative age-related macular degeneration, right eye, with active choroidal neovascularization: Secondary | ICD-10-CM | POA: Diagnosis not present

## 2021-05-14 DIAGNOSIS — I498 Other specified cardiac arrhythmias: Secondary | ICD-10-CM | POA: Insufficient documentation

## 2021-05-14 NOTE — Progress Notes (Signed)
?  ?Cardiology Office Note ? ? ?Date:  05/15/2021  ? ?ID:  Brittany Matthews, DOB 1956-02-05, MRN [id number] ? ?PCP:  Brittany Gouty, FNP  ?Cardiologist:   Minus Breeding, MD ?Referring:  Brittany Gouty, FNP ? ?Chief Complaint  ?Patient presents with  ? Loss of Consciousness  ? ? ?History of Present Illness: ?Brittany Matthews is a 66 y.o. female who was referred by Brittany Gouty, FNP for evaluation of arrhythmia. She had PVCs and I sent her for an echo.  She had a small pericardial effusion.  Monitor demonstrated ventricular bigeminy.   She saw her PCP in late Dec reporting that she had syncope 3 times.   She was in ventricular bigeminy on a monitor.   She had a negative POET (Plain Old Exercise Treadmill) .    ? ?Since I last saw her she is having no further syncope or presyncope.  The patient denies any new symptoms such as chest discomfort, neck or arm discomfort. There has been no new shortness of breath, PND or orthopnea. There have been no reported palpitations, presyncope or syncope.  ? ? ?Past Medical History:  ?Diagnosis Date  ? Cancer [facility])   ? Skin  ? Macular degeneration disease   ? ? ?Past Surgical History:  ?Procedure Laterality Date  ? CARPAL TUNNEL RELEASE    ? CESAREAN SECTION    ? ? ? ?Current Outpatient Medications  ?Medication Sig Dispense Refill  ? Cyanocobalamin (B-12 PO) Take by mouth once a week.    ? Multiple Vitamins-Minerals (ICAPS AREDS FORMULA PO) Take by mouth 2 (two) times daily.    ? Niacin (VITAMIN B-3 PO) Take by mouth 2 (two) times daily.    ? Ocular Implant (OCULAR RESERVOIR IZ) by Intravitreal route.    ? Vitamin D, Ergocalciferol, (DRISDOL) 1.25 MG (50000 UNIT) CAPS capsule Take 1 capsule (50,000 Units total) by mouth every 7 (seven) days. 5 capsule 2  ? ?No current facility-administered medications for this visit.  ? ? ?Allergies:   Penicillins and Sulfa antibiotics  ? ? ?ROS:  Please see the history of present illness.   Otherwise, review of systems are positive for none.   All other  systems are reviewed and negative.  ? ? ?PHYSICAL EXAM: ?VS:  BP 114/70   Pulse 79   Ht 5\' 7"  (1.702 m)   Wt 146 lb (66.2 kg)   SpO2 96%   BMI 22.87 kg/m?  , BMI Body mass index is 22.87 kg/m?. ?GENERAL:  Well appearing ?NECK:  No jugular venous distention, waveform within normal limits, carotid upstroke brisk and symmetric, no bruits, no thyromegaly ?LUNGS:  Clear to auscultation bilaterally ?CHEST:  Unremarkable ?HEART:  PMI not displaced or sustained,S1 and S2 within normal limits, no S3, no S4, no clicks, no rubs, no murmurs ?ABD:  Flat, positive bowel sounds normal in frequency in pitch, no bruits, no rebound, no guarding, no midline pulsatile mass, no hepatomegaly, no splenomegaly ?EXT:  2 plus pulses throughout, no edema, no cyanosis no clubbing ? ? ?EKG:  EKG is not ordered today. ? ?Recent Labs: ?03/03/2021: ALT 13; BUN 11; Creatinine, Ser 0.88; Hemoglobin 13.6; Platelets 270; Potassium 4.1; Sodium 137; TSH 3.690  ? ? ?Lipid Panel ?   ?Component Value Date/Time  ? CHOL 242 (H) 01/20/2021 0350  ? TRIG 121 01/20/2021 0942  ? HDL 62 01/20/2021 0942  ? CHOLHDL 3.9 01/20/2021 0942  ? LDLCALC 159 (H) 01/20/2021 0938  ? ?  ? ?Wt Readings from  Last 3 Encounters:  ?05/15/21 146 lb (66.2 kg)  ?03/03/21 143 lb 9.6 oz (65.1 kg)  ?01/28/21 146 lb (66.2 kg)  ?  ? ? ?Other studies Reviewed: ?Additional studies/ records that were reviewed today include:  None. ?Review of the above records demonstrates:  Please see elsewhere in the note.   ? ? ?ASSESSMENT AND PLAN: ? ? ?SYNCOPE:   She had a negative POET (Plain Old Exercise Treadmill).  She has had no further events.   She had no inducible arrhythmia.  She had no sustained arrhythmia.  7.5% of her beats were PVCs but there were no nonsustained beats or events that should have caused syncope.   The last event was about 10 years ago.  At this point I do not think that further monitoring will be helpful but she will let me know if she has any increasing symptoms.  We had  a long discussion about this.  Electrolytes have been unremarkable.  Echocardiogram was essentially unremarkable other than a small effusion below.  ? ?PERICARDIAL EFFUSION: I will repeat this in June.   ? ?DYSLIPIDEMIA: She had no coronary calcium.   No change in therapy.  ? ?VENTRICULAR BIGEMINY: As above there were no sustained arrhythmias.  7.5% of the time she had PVCs.  I will follow this symptomatically. ? ?Current medicines are reviewed at length with the patient today.  The patient does not have concerns regarding medicines. ? ?The following changes have been made: None ? ?Labs/ tests ordered today include: None ? ?No orders of the defined types were placed in this encounter. ? ? ? ? ?Disposition:   FU with 12 months ? ? ?Signed, ?Minus Breeding, MD  ?05/15/2021 3:00 PM    ?[place] ? ? ? ?

## 2021-05-15 ENCOUNTER — Other Ambulatory Visit: Payer: Self-pay

## 2021-05-15 ENCOUNTER — Encounter: Payer: Self-pay | Admitting: Cardiology

## 2021-05-15 ENCOUNTER — Ambulatory Visit: Payer: Medicare HMO | Admitting: Cardiology

## 2021-05-15 VITALS — BP 114/70 | HR 79 | Ht 67.0 in | Wt 146.0 lb

## 2021-05-15 DIAGNOSIS — E785 Hyperlipidemia, unspecified: Secondary | ICD-10-CM | POA: Diagnosis not present

## 2021-05-15 DIAGNOSIS — R55 Syncope and collapse: Secondary | ICD-10-CM | POA: Diagnosis not present

## 2021-05-15 DIAGNOSIS — I498 Other specified cardiac arrhythmias: Secondary | ICD-10-CM | POA: Diagnosis not present

## 2021-05-15 DIAGNOSIS — I3139 Other pericardial effusion (noninflammatory): Secondary | ICD-10-CM | POA: Diagnosis not present

## 2021-05-15 NOTE — Patient Instructions (Addendum)
Medication Instructions:  ?Your physician recommends that you continue on your current medications as directed. Please refer to the Current Medication list given to you today.  ?*If you need a refill on your cardiac medications before your next appointment, please call your pharmacy* ? ? ?Lab Work: ?None ?If you have labs (blood work) drawn today and your tests are completely normal, you will receive your results only by: ?MyChart Message (if you have MyChart) OR ?A paper copy in the mail ?If you have any lab test that is abnormal or we need to change your treatment, we will call you to review the results. ? ? ?Testing/Procedures: ?Your physician has requested that you have an echocardiogram. Echocardiography is a painless test that uses sound waves to create images of your heart. It provides your doctor with information about the size and shape of your heart and how well your heart?s chambers and valves are working. This procedure takes approximately one hour. There are no restrictions for this procedure. ? ? ? ? ?Follow-Up: ?At [facility], you and your health needs are our priority.  As part of our continuing mission to provide you with exceptional heart care, we have created designated Provider Care Teams.  These Care Teams include your primary Cardiologist (physician) and Advanced Practice Providers (APPs -  Physician Assistants and Nurse Practitioners) who all work together to provide you with the care you need, when you need it. ? ?We recommend signing up for the patient portal called "MyChart".  Sign up information is provided on this After Visit Summary.  MyChart is used to connect with patients for Virtual Visits (Telemedicine).  Patients are able to view lab/test results, encounter notes, upcoming appointments, etc.  Non-urgent messages can be sent to your provider as well.   ?To learn more about what you can do with MyChart, go to [email or web address].   ? ?Your next appointment:   ?1  year(s) ? ?The format for your next appointment:   ?In Person in [place] ? ?Provider:   ?Minus Breeding, MD ? ? ?Other Instructions ?  ?

## 2021-05-26 DIAGNOSIS — H35453 Secondary pigmentary degeneration, bilateral: Secondary | ICD-10-CM | POA: Diagnosis not present

## 2021-05-26 DIAGNOSIS — H353211 Exudative age-related macular degeneration, right eye, with active choroidal neovascularization: Secondary | ICD-10-CM | POA: Diagnosis not present

## 2021-05-26 DIAGNOSIS — H2513 Age-related nuclear cataract, bilateral: Secondary | ICD-10-CM | POA: Diagnosis not present

## 2021-05-26 DIAGNOSIS — H35362 Drusen (degenerative) of macula, left eye: Secondary | ICD-10-CM | POA: Diagnosis not present

## 2021-06-30 DIAGNOSIS — H5200 Hypermetropia, unspecified eye: Secondary | ICD-10-CM | POA: Diagnosis not present

## 2021-07-20 DIAGNOSIS — H35362 Drusen (degenerative) of macula, left eye: Secondary | ICD-10-CM | POA: Diagnosis not present

## 2021-07-20 DIAGNOSIS — H353211 Exudative age-related macular degeneration, right eye, with active choroidal neovascularization: Secondary | ICD-10-CM | POA: Diagnosis not present

## 2021-07-21 ENCOUNTER — Encounter: Payer: Self-pay | Admitting: Family Medicine

## 2021-07-21 ENCOUNTER — Ambulatory Visit (INDEPENDENT_AMBULATORY_CARE_PROVIDER_SITE_OTHER): Payer: Medicare HMO | Admitting: Family Medicine

## 2021-07-21 VITALS — BP 110/68 | HR 74 | Temp 98.2°F | Ht 67.0 in | Wt 145.0 lb

## 2021-07-21 DIAGNOSIS — E559 Vitamin D deficiency, unspecified: Secondary | ICD-10-CM | POA: Diagnosis not present

## 2021-07-21 DIAGNOSIS — E785 Hyperlipidemia, unspecified: Secondary | ICD-10-CM

## 2021-07-21 DIAGNOSIS — Z789 Other specified health status: Secondary | ICD-10-CM | POA: Diagnosis not present

## 2021-07-21 DIAGNOSIS — I499 Cardiac arrhythmia, unspecified: Secondary | ICD-10-CM

## 2021-07-21 NOTE — Progress Notes (Signed)
?  ? ?Subjective:  ?Patient ID: Brittany Matthews, female    DOB: 14-Nov-1955, 66 y.o.   MRN: [id number] ? ?Patient Care Team: ?Baruch Gouty, FNP as PCP - General (Family Medicine) ?Minus Breeding, MD as PCP - Cardiology (Cardiology)  ? ?Chief Complaint:  Medical Management of Chronic Issues (Patient requests cholesterol check) ? ? ?HPI: ?Brittany Matthews is a 66 y.o. female presenting on 07/21/2021 for Medical Management of Chronic Issues (Patient requests cholesterol check) ? ?Pt presents today for management of chronic medical conditions. She is a vegan but does incorporate plenty of protein in her diet. She was found to be Vit D deficient at last visit and was started on once weekly therapy. States she took this for 5 weeks and did not refill the prescription. No arthralgias, trouble walking, or recent fractures. She is on Vit B12 repletion therapy daily. At last visit she was referred to cardiology for bradycardia, bigeminy, and syncope. ETT and Zio were completed. ETT negative for ischemic changes. Zio revealed low percentage of arrhythmia. Implanted monitor was considered but deferred due to resolution of symptoms. She denies any palpitations, dizziness, presyncope or syncope since last visit. Her cholesterol was noted to be elevated and she has been taking Red Yeast Rice, pt is fasting today, will repeat labs.  ? ?Relevant past medical, surgical, family, and social history reviewed and updated as indicated.  ?Allergies and medications reviewed and updated. Data reviewed: Chart in Epic. ? ? ?Past Medical History:  ?Diagnosis Date  ? Cancer [facility])   ? Skin  ? Macular degeneration disease   ? ? ?Past Surgical History:  ?Procedure Laterality Date  ? CARPAL TUNNEL RELEASE    ? CESAREAN SECTION    ? ? ?Social History  ? ?Socioeconomic History  ? Marital status: Married  ?  Spouse name: Not on file  ? Number of children: Not on file  ? Years of education: Not on file  ? Highest education level: Not on file  ?Occupational History   ? Not on file  ?Tobacco Use  ? Smoking status: Former  ?  Types: Cigarettes  ? Smokeless tobacco: Never  ? Tobacco comments:  ?  Quit 20 years ago  ?Substance and Sexual Activity  ? Alcohol use: Not on file  ? Drug use: Not on file  ? Sexual activity: Not on file  ?Other Topics Concern  ? Not on file  ?Social History Narrative  ? Lives with husband  ? ?Social Determinants of Health  ? ?Financial Resource Strain: Not on file  ?Food Insecurity: Not on file  ?Transportation Needs: Not on file  ?Physical Activity: Not on file  ?Stress: Not on file  ?Social Connections: Not on file  ?Intimate Partner Violence: Not on file  ? ? ?Outpatient Encounter Medications as of 07/21/2021  ?Medication Sig  ? Cyanocobalamin (B-12 PO) Take by mouth once a week.  ? Multiple Vitamins-Minerals (ICAPS AREDS FORMULA PO) Take by mouth 2 (two) times daily.  ? Niacin (VITAMIN B-3 PO) Take by mouth 2 (two) times daily.  ? Ocular Implant (OCULAR RESERVOIR IZ) by Intravitreal route.  ? [DISCONTINUED] Vitamin D, Ergocalciferol, (DRISDOL) 1.25 MG (50000 UNIT) CAPS capsule Take 1 capsule (50,000 Units total) by mouth every 7 (seven) days.  ? ?No facility-administered encounter medications on file as of 07/21/2021.  ? ? ?Allergies  ?Allergen Reactions  ? Penicillins Other (See Comments)  ? Sulfa Antibiotics Other (See Comments)  ? ? ?Review of Systems  ?Constitutional:  Negative for  activity change, appetite change, chills, diaphoresis, fatigue, fever and unexpected weight change.  ?HENT: Negative.    ?Eyes: Negative.  Negative for photophobia and visual disturbance.  ?Respiratory:  Negative for cough, chest tightness and shortness of breath.   ?Cardiovascular:  Negative for chest pain, palpitations and leg swelling.  ?Gastrointestinal:  Negative for abdominal pain, blood in stool, constipation, diarrhea, nausea and vomiting.  ?Endocrine: Negative.   ?Genitourinary:  Negative for decreased urine volume, difficulty urinating, dysuria, frequency and  urgency.  ?Musculoskeletal:  Negative for arthralgias, gait problem and myalgias.  ?Skin: Negative.   ?Allergic/Immunologic: Negative.   ?Neurological:  Negative for dizziness, tremors, seizures, syncope, facial asymmetry, speech difficulty, weakness, light-headedness, numbness and headaches.  ?Hematological: Negative.   ?Psychiatric/Behavioral:  Negative for confusion, hallucinations, sleep disturbance and suicidal ideas.   ?All other systems reviewed and are negative. ? ?   ? ?Objective:  ?BP 110/68   Pulse 74   Temp 98.2 ?F (36.8 ?C)   Ht '5\' 7"'$  (1.702 m)   Wt 145 lb (65.8 kg)   SpO2 95%   BMI 22.71 kg/m?   ? ?Wt Readings from Last 3 Encounters:  ?07/21/21 145 lb (65.8 kg)  ?05/15/21 146 lb (66.2 kg)  ?03/03/21 143 lb 9.6 oz (65.1 kg)  ? ? ?Physical Exam ?Vitals and nursing note reviewed.  ?Constitutional:   ?   General: She is not in acute distress. ?   Appearance: Normal appearance. She is well-developed, well-groomed and normal weight. She is not ill-appearing, toxic-appearing or diaphoretic.  ?HENT:  ?   Head: Normocephalic and atraumatic.  ?   Jaw: There is normal jaw occlusion.  ?   Right Ear: Hearing normal.  ?   Left Ear: Hearing normal.  ?   Nose: Nose normal.  ?   Mouth/Throat:  ?   Lips: Pink.  ?   Mouth: Mucous membranes are moist.  ?   Pharynx: Oropharynx is clear. Uvula midline.  ?Eyes:  ?   General: Lids are normal.  ?   Extraocular Movements: Extraocular movements intact.  ?   Conjunctiva/sclera: Conjunctivae normal.  ?   Pupils: Pupils are equal, round, and reactive to light.  ?Neck:  ?   Thyroid: No thyroid mass, thyromegaly or thyroid tenderness.  ?   Vascular: No carotid bruit or JVD.  ?   Trachea: Trachea and phonation normal.  ?Cardiovascular:  ?   Rate and Rhythm: Normal rate and regular rhythm.  ?   Chest Wall: PMI is not displaced.  ?   Pulses: Normal pulses.  ?   Heart sounds: Normal heart sounds. No murmur heard. ?  No friction rub. No gallop.  ?Pulmonary:  ?   Effort: Pulmonary  effort is normal. No respiratory distress.  ?   Breath sounds: Normal breath sounds. No wheezing.  ?Abdominal:  ?   General: Bowel sounds are normal. There is no abdominal bruit.  ?   Palpations: There is no hepatomegaly or splenomegaly.  ?Musculoskeletal:     ?   General: Normal range of motion.  ?   Cervical back: Normal range of motion and neck supple.  ?   Right lower leg: No edema.  ?   Left lower leg: No edema.  ?Lymphadenopathy:  ?   Cervical: No cervical adenopathy.  ?Skin: ?   General: Skin is warm and dry.  ?   Capillary Refill: Capillary refill takes less than 2 seconds.  ?   Coloration: Skin is not cyanotic, jaundiced or  pale.  ?   Findings: No rash.  ?Neurological:  ?   General: No focal deficit present.  ?   Mental Status: She is alert and oriented to person, place, and time.  ?   Sensory: Sensation is intact.  ?   Motor: Motor function is intact.  ?   Coordination: Coordination is intact.  ?   Gait: Gait is intact.  ?   Deep Tendon Reflexes: Reflexes are normal and symmetric.  ?Psychiatric:     ?   Attention and Perception: Attention and perception normal.     ?   Mood and Affect: Mood and affect normal.     ?   Speech: Speech normal.     ?   Behavior: Behavior normal. Behavior is cooperative.     ?   Thought Content: Thought content normal.     ?   Cognition and Memory: Cognition and memory normal.     ?   Judgment: Judgment normal.  ? ? ?Results for orders placed or performed during the hospital encounter of 03/25/21  ?EXERCISE TOLERANCE TEST (ETT)  ?Result Value Ref Range  ? Rest HR 89.0 bpm  ? Rest BP 113/84 mmHg  ? Exercise duration (min) 11 min  ? Exercise duration (sec) 1 sec  ? Estimated workload 13.4   ? Peak HR 173 bpm  ? Peak BP 154/74 mmHg  ? MPHR 153 bpm  ? Percent HR 113.0 %  ? Angina Index 0   ? Duke Treadmill Score 11   ? ST Depression (mm) 0 mm  ? ?   ? ?Pertinent labs & imaging results that were available during my care of the patient were reviewed by me and considered in my medical  decision making. ? ?Assessment & Plan:  ?Lanetra was seen today for medical management of chronic issues. ? ?Diagnoses and all orders for this visit: ? ?Vitamin D deficiency ?Currently not on repletion therapy. Will

## 2021-07-22 LAB — BMP8+EGFR
BUN/Creatinine Ratio: 12 (ref 12–28)
BUN: 11 mg/dL (ref 8–27)
CO2: 25 mmol/L (ref 20–29)
Calcium: 9.2 mg/dL (ref 8.7–10.3)
Chloride: 103 mmol/L (ref 96–106)
Creatinine, Ser: 0.91 mg/dL (ref 0.57–1.00)
Glucose: 93 mg/dL (ref 70–99)
Potassium: 4.8 mmol/L (ref 3.5–5.2)
Sodium: 141 mmol/L (ref 134–144)
eGFR: 69 mL/min/{1.73_m2} (ref >59)

## 2021-07-22 LAB — CBC WITH DIFFERENTIAL/PLATELET
Basophils Absolute: 0 10*3/uL (ref 0.0–0.2)
Basos: 1 %
EOS (ABSOLUTE): 0.1 10*3/uL (ref 0.0–0.4)
Eos: 1 %
Hematocrit: 37.5 % (ref 34.0–46.6)
Hemoglobin: 13 g/dL (ref 11.1–15.9)
Immature Grans (Abs): 0 10*3/uL (ref 0.0–0.1)
Immature Granulocytes: 0 %
Lymphocytes Absolute: 1.8 10*3/uL (ref 0.7–3.1)
Lymphs: 34 %
MCH: 30.3 pg (ref 26.6–33.0)
MCHC: 34.7 g/dL (ref 31.5–35.7)
MCV: 87 fL (ref 79–97)
Monocytes Absolute: 0.3 10*3/uL (ref 0.1–0.9)
Monocytes: 5 %
Neutrophils Absolute: 3.1 10*3/uL (ref 1.4–7.0)
Neutrophils: 59 %
Platelets: 266 10*3/uL (ref 150–450)
RBC: 4.29 x10E6/uL (ref 3.77–5.28)
RDW: 12.2 % (ref 11.7–15.4)
WBC: 5.3 10*3/uL (ref 3.4–10.8)

## 2021-07-22 LAB — LIPID PANEL
Chol/HDL Ratio: 4.2 ratio (ref 0.0–4.4)
Cholesterol, Total: 257 mg/dL — ABNORMAL HIGH (ref 100–199)
HDL: 61 mg/dL (ref >39)
LDL Chol Calc (NIH): 171 mg/dL — ABNORMAL HIGH (ref 0–99)
Triglycerides: 139 mg/dL (ref 0–149)
VLDL Cholesterol Cal: 25 mg/dL (ref 5–40)

## 2021-07-22 LAB — VITAMIN D 25 HYDROXY (VIT D DEFICIENCY, FRACTURES): Vit D, 25-Hydroxy: 20.1 ng/mL — ABNORMAL LOW (ref 30.0–100.0)

## 2021-08-25 ENCOUNTER — Telehealth (HOSPITAL_COMMUNITY): Payer: Self-pay | Admitting: Cardiology

## 2021-08-25 NOTE — Telephone Encounter (Signed)
Patient cancelled echocardiogram for reason below:  08/25/2021 3:03 PM [id number], ASHLEY N  Cancel Rsn: Patient (doesn't feel it's needed)  Order will be removed from the active echo Wq.

## 2021-08-26 ENCOUNTER — Other Ambulatory Visit (HOSPITAL_BASED_OUTPATIENT_CLINIC_OR_DEPARTMENT_OTHER): Payer: Medicare HMO

## 2021-08-28 ENCOUNTER — Other Ambulatory Visit (HOSPITAL_BASED_OUTPATIENT_CLINIC_OR_DEPARTMENT_OTHER): Payer: Medicare HMO

## 2021-10-06 DIAGNOSIS — H35362 Drusen (degenerative) of macula, left eye: Secondary | ICD-10-CM | POA: Diagnosis not present

## 2021-10-06 DIAGNOSIS — H353211 Exudative age-related macular degeneration, right eye, with active choroidal neovascularization: Secondary | ICD-10-CM | POA: Diagnosis not present

## 2021-11-06 DIAGNOSIS — L820 Inflamed seborrheic keratosis: Secondary | ICD-10-CM | POA: Diagnosis not present

## 2021-11-06 DIAGNOSIS — L538 Other specified erythematous conditions: Secondary | ICD-10-CM | POA: Diagnosis not present

## 2021-11-06 DIAGNOSIS — Z789 Other specified health status: Secondary | ICD-10-CM | POA: Diagnosis not present

## 2021-12-28 DIAGNOSIS — H35362 Drusen (degenerative) of macula, left eye: Secondary | ICD-10-CM | POA: Diagnosis not present

## 2021-12-28 DIAGNOSIS — H35453 Secondary pigmentary degeneration, bilateral: Secondary | ICD-10-CM | POA: Diagnosis not present

## 2021-12-28 DIAGNOSIS — H353211 Exudative age-related macular degeneration, right eye, with active choroidal neovascularization: Secondary | ICD-10-CM | POA: Diagnosis not present

## 2022-03-01 DIAGNOSIS — H353211 Exudative age-related macular degeneration, right eye, with active choroidal neovascularization: Secondary | ICD-10-CM | POA: Diagnosis not present

## 2022-03-01 DIAGNOSIS — H35362 Drusen (degenerative) of macula, left eye: Secondary | ICD-10-CM | POA: Diagnosis not present

## 2022-03-01 DIAGNOSIS — H43813 Vitreous degeneration, bilateral: Secondary | ICD-10-CM | POA: Diagnosis not present

## 2022-03-22 DIAGNOSIS — H52223 Regular astigmatism, bilateral: Secondary | ICD-10-CM | POA: Diagnosis not present

## 2022-03-22 DIAGNOSIS — H2513 Age-related nuclear cataract, bilateral: Secondary | ICD-10-CM | POA: Diagnosis not present

## 2022-03-22 DIAGNOSIS — H5203 Hypermetropia, bilateral: Secondary | ICD-10-CM | POA: Diagnosis not present

## 2022-03-22 DIAGNOSIS — H524 Presbyopia: Secondary | ICD-10-CM | POA: Diagnosis not present

## 2022-05-24 DIAGNOSIS — H353211 Exudative age-related macular degeneration, right eye, with active choroidal neovascularization: Secondary | ICD-10-CM | POA: Diagnosis not present

## 2022-07-23 ENCOUNTER — Encounter: Payer: Self-pay | Admitting: Family Medicine

## 2022-07-23 ENCOUNTER — Ambulatory Visit (INDEPENDENT_AMBULATORY_CARE_PROVIDER_SITE_OTHER): Payer: Medicare HMO | Admitting: Family Medicine

## 2022-07-23 VITALS — BP 122/76 | HR 83 | Temp 97.3°F | Ht 67.0 in | Wt 147.0 lb

## 2022-07-23 DIAGNOSIS — Z0001 Encounter for general adult medical examination with abnormal findings: Secondary | ICD-10-CM

## 2022-07-23 DIAGNOSIS — Z0000 Encounter for general adult medical examination without abnormal findings: Secondary | ICD-10-CM | POA: Diagnosis not present

## 2022-07-23 DIAGNOSIS — Z789 Other specified health status: Secondary | ICD-10-CM | POA: Diagnosis not present

## 2022-07-23 DIAGNOSIS — E785 Hyperlipidemia, unspecified: Secondary | ICD-10-CM | POA: Diagnosis not present

## 2022-07-23 DIAGNOSIS — E559 Vitamin D deficiency, unspecified: Secondary | ICD-10-CM | POA: Diagnosis not present

## 2022-07-23 LAB — LIPID PANEL

## 2022-07-23 NOTE — Progress Notes (Signed)
Complete physical exam  Patient: Brittany Matthews   DOB: January 20, 1956   67 y.o. Female  MRN: [id number]  Subjective:    Chief Complaint  Patient presents with   Annual Exam    Brittany Matthews is a 67 y.o. female who presents today for a complete physical exam. She reports consuming a  vegan  diet.  Exercises daily  She generally feels well. She reports sleeping well. She does have additional problems to discuss today.    Most recent fall risk assessment:    07/23/2022    8:54 AM  Fall Risk   Falls in the past year? 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   Pericardial effusion 03/18/2021   Dyslipidemia 03/18/2021   Exudative age-related macular degeneration of right eye with active choroidal neovascularization (HCC) 01/20/2021   Vegan diet 01/20/2021   Past Medical History:  Diagnosis Date   Cancer (HCC)    Skin   Macular degeneration disease    Past Surgical History:  Procedure Laterality Date   CARPAL TUNNEL RELEASE     CESAREAN SECTION     Social History   Tobacco Use   Smoking status: Former    Types: Cigarettes    Quit date: 2009    Years since quitting: 15.3   Smokeless tobacco: Never   Tobacco comments:    Quit 20 years ago  Vaping Use   Vaping Use: Never used  Substance Use Topics   Drug use: Never   Social History   Socioeconomic History   Marital status: Married    Spouse name: Not on file   Number of children: Not on file   Years of education: Not on file   Highest education level: Not on file  Occupational History   Not on file  Tobacco Use   Smoking status: Former    Types: Cigarettes    Quit date: 2009    Years since  quitting: 15.3   Smokeless tobacco: Never   Tobacco comments:    Quit 20 years ago  Psychologist, educational Use   Vaping Use: Never used  Substance and Sexual Activity   Alcohol use: Not on file   Drug use: Never   Sexual activity: Not on file  Other Topics Concern   Not on file  Social History Narrative   Lives with husband   Social Determinants of Health   Financial Resource Strain: Not on file  Food Insecurity: Not on file  Transportation Needs: Not on file  Physical Activity: Not on file  Stress: Not on file  Social Connections: Not on file  Intimate Partner Violence: Not on file   Family Status  Relation Name Status   Mother  Deceased   Father  Deceased   MGM  Deceased   MGF  Deceased   PGM  Deceased   PGF  Deceased   Family History  Problem Relation Age of Onset   COPD Mother    Parkinsonism Father    Allergies  Allergen Reactions   Penicillins Other (See Comments)   Sulfa Antibiotics Other (See Comments)  Patient Care Team: Sonny Masters, FNP as PCP - General (Family Medicine) Rollene Rotunda, MD as PCP - Cardiology (Cardiology)   Outpatient Medications Prior to Visit  Medication Sig   Cyanocobalamin (B-12 PO) Take by mouth once a week.   Multiple Vitamins-Minerals (ICAPS AREDS FORMULA PO) Take by mouth 2 (two) times daily.   Niacin (VITAMIN B-3 PO) Take by mouth 2 (two) times daily.   Ocular Implant (OCULAR RESERVOIR IZ) by Intravitreal route.   No facility-administered medications prior to visit.    Review of Systems  Eyes:        Visual changes - chronic   All other systems reviewed and are negative.      Objective:     BP 122/76   Pulse 83   Temp (!) 97.3 F (36.3 C) (Temporal)   Ht 5\' 7"  (1.702 m)   Wt 147 lb (66.7 kg)   SpO2 98%   BMI 23.02 kg/m  BP Readings from Last 3 Encounters:  07/23/22 122/76  07/21/21 110/68  05/15/21 114/70   Wt Readings from Last 3 Encounters:  07/23/22 147 lb (66.7 kg)  07/21/21 145 lb (65.8 kg)   05/15/21 146 lb (66.2 kg)   SpO2 Readings from Last 3 Encounters:  07/23/22 98%  07/21/21 95%  05/15/21 96%      Physical Exam Vitals and nursing note reviewed.  Constitutional:      General: She is not in acute distress.    Appearance: Normal appearance. She is well-developed, well-groomed and normal weight. She is not ill-appearing, toxic-appearing or diaphoretic.  HENT:     Head: Normocephalic and atraumatic.     Jaw: There is normal jaw occlusion.     Right Ear: Hearing, tympanic membrane, ear canal and external ear normal.     Left Ear: Hearing, tympanic membrane, ear canal and external ear normal.     Nose: Nose normal.     Mouth/Throat:     Lips: Pink.     Mouth: Mucous membranes are moist.     Pharynx: Oropharynx is clear. Uvula midline.  Eyes:     General: Lids are normal.     Extraocular Movements: Extraocular movements intact.     Conjunctiva/sclera: Conjunctivae normal.     Pupils: Pupils are equal, round, and reactive to light.  Neck:     Thyroid: No thyroid mass, thyromegaly or thyroid tenderness.     Vascular: No carotid bruit or JVD.     Trachea: Trachea and phonation normal.  Cardiovascular:     Rate and Rhythm: Normal rate and regular rhythm.     Chest Wall: PMI is not displaced.     Pulses: Normal pulses.     Heart sounds: Normal heart sounds. No murmur heard.    No friction rub. No gallop.  Pulmonary:     Effort: Pulmonary effort is normal. No respiratory distress.     Breath sounds: Normal breath sounds. No wheezing.  Abdominal:     General: Bowel sounds are normal. There is no distension or abdominal bruit.     Palpations: Abdomen is soft. There is no hepatomegaly or splenomegaly.     Tenderness: There is no abdominal tenderness. There is no right CVA tenderness or left CVA tenderness.     Hernia: No hernia is present.  Genitourinary:    Comments: Deferred Musculoskeletal:        General: Normal range of motion.     Cervical back: Normal  range of motion and neck supple.  Right lower leg: No edema.     Left lower leg: No edema.  Lymphadenopathy:     Cervical: No cervical adenopathy.  Skin:    General: Skin is warm and dry.     Capillary Refill: Capillary refill takes less than 2 seconds.     Coloration: Skin is not cyanotic, jaundiced or pale.     Findings: No rash.  Neurological:     General: No focal deficit present.     Mental Status: She is alert and oriented to person, place, and time.     Sensory: Sensation is intact.     Motor: Motor function is intact.     Coordination: Coordination is intact.     Gait: Gait is intact.     Deep Tendon Reflexes: Reflexes are normal and symmetric.  Psychiatric:        Attention and Perception: Attention and perception normal.        Mood and Affect: Mood and affect normal.        Speech: Speech normal.        Behavior: Behavior normal. Behavior is cooperative.        Thought Content: Thought content normal.        Cognition and Memory: Cognition and memory normal.        Judgment: Judgment normal.       Last CBC Lab Results  Component Value Date   WBC 5.3 07/21/2021   HGB 13.0 07/21/2021   HCT 37.5 07/21/2021   MCV 87 07/21/2021   MCH 30.3 07/21/2021   RDW 12.2 07/21/2021   PLT 266 07/21/2021   Last metabolic panel Lab Results  Component Value Date   GLUCOSE 93 07/21/2021   NA 141 07/21/2021   K 4.8 07/21/2021   CL 103 07/21/2021   CO2 25 07/21/2021   BUN 11 07/21/2021   CREATININE 0.91 07/21/2021   EGFR 69 07/21/2021   CALCIUM 9.2 07/21/2021   PROT 6.6 03/03/2021   ALBUMIN 4.8 03/03/2021   LABGLOB 1.8 03/03/2021   AGRATIO 2.7 (H) 03/03/2021   BILITOT 0.5 03/03/2021   ALKPHOS 82 03/03/2021   AST 17 03/03/2021   ALT 13 03/03/2021   Last lipids Lab Results  Component Value Date   CHOL 257 (H) 07/21/2021   HDL 61 07/21/2021   LDLCALC 171 (H) 07/21/2021   TRIG 139 07/21/2021   CHOLHDL 4.2 07/21/2021   Last thyroid functions Lab Results   Component Value Date   TSH 3.690 03/03/2021   T4TOTAL 7.6 01/20/2021   Last vitamin D Lab Results  Component Value Date   VD25OH 20.1 (L) 07/21/2021   Last vitamin B12 and Folate Lab Results  Component Value Date   VITAMINB12 752 01/20/2021        Assessment & Plan:    Routine Health Maintenance and Physical Exam  Immunization History  Administered Date(s) Administered   PFIZER(Purple Top)SARS-COV-2 Vaccination 05/30/2019, 06/20/2019    Health Maintenance  Topic Date Due   Medicare Annual Wellness (AWV)  Never done   COVID-19 Vaccine (3 - 2023-24 season) 08/08/2022 (Originally 11/13/2021)   Zoster Vaccines- Shingrix (1 of 2) 10/23/2022 (Originally 08/30/2003)   Pneumonia Vaccine 15+ Years old (1 of 1 - PCV) 07/23/2023 (Originally 08/30/2018)   MAMMOGRAM  07/23/2023 (Originally 08/30/2003)   DEXA SCAN  07/23/2023 (Originally 08/30/2018)   COLONOSCOPY (Pts 45-3yrs Insurance coverage will need to be confirmed)  07/23/2023 (Originally 08/30/1998)   INFLUENZA VACCINE  10/14/2022   Hepatitis C Screening  Completed  HPV VACCINES  Aged Out   DTaP/Tdap/Td  Discontinued    Discussed health benefits of physical activity, and encouraged her to engage in regular exercise appropriate for her age and condition.  Problem List Items Addressed This Visit       Other   Vegan diet   Relevant Orders   VITAMIN D 25 Hydroxy (Vit-D Deficiency, Fractures)   Vitamin B12   Dyslipidemia   Relevant Orders   CMP14+EGFR   Lipid panel   Vitamin D deficiency   Relevant Orders   VITAMIN D 25 Hydroxy (Vit-D Deficiency, Fractures)   Other Visit Diagnoses     Annual physical exam    -  Primary   Relevant Orders   CBC with Differential/Platelet   CMP14+EGFR   Lipid panel   Thyroid Panel With TSH   VITAMIN D 25 Hydroxy (Vit-D Deficiency, Fractures)   Vitamin B12      Return in 1 year (on 07/23/2023), or if symptoms worsen or fail to improve, for CPE schedule AWV.    The above  assessment and management plan was discussed with the patient. The patient verbalized understanding of and has agreed to the management plan. Patient is aware to call the clinic if they develop any new symptoms or if symptoms fail to improve or worsen. Patient is aware when to return to the clinic for a follow-up visit. Patient educated on when it is appropriate to go to the emergency department.   Kari Baars, FNP-C [facility] [address] [place], [place] [address] [phone]

## 2022-07-24 LAB — CMP14+EGFR
ALT: 18 IU/L (ref 0–32)
AST: 23 IU/L (ref 0–40)
Albumin/Globulin Ratio: 2.1 (ref 1.2–2.2)
Albumin: 4.5 g/dL (ref 3.9–4.9)
Alkaline Phosphatase: 75 IU/L (ref 44–121)
BUN/Creatinine Ratio: 10 — ABNORMAL LOW (ref 12–28)
BUN: 10 mg/dL (ref 8–27)
Bilirubin Total: <0.2 mg/dL (ref 0.0–1.2)
CO2: 22 mmol/L (ref 20–29)
Calcium: 9.5 mg/dL (ref 8.7–10.3)
Chloride: 100 mmol/L (ref 96–106)
Creatinine, Ser: 1.04 mg/dL — ABNORMAL HIGH (ref 0.57–1.00)
Globulin, Total: 2.1 g/dL (ref 1.5–4.5)
Glucose: 95 mg/dL (ref 70–99)
Potassium: 4.4 mmol/L (ref 3.5–5.2)
Sodium: 138 mmol/L (ref 134–144)
Total Protein: 6.6 g/dL (ref 6.0–8.5)
eGFR: 59 mL/min/{1.73_m2} — ABNORMAL LOW (ref >59)

## 2022-07-24 LAB — CBC WITH DIFFERENTIAL/PLATELET
Basophils Absolute: 0 10*3/uL (ref 0.0–0.2)
Basos: 0 %
EOS (ABSOLUTE): 0.1 10*3/uL (ref 0.0–0.4)
Eos: 2 %
Hematocrit: 41.1 % (ref 34.0–46.6)
Hemoglobin: 13.3 g/dL (ref 11.1–15.9)
Immature Grans (Abs): 0 10*3/uL (ref 0.0–0.1)
Immature Granulocytes: 0 %
Lymphocytes Absolute: 1.9 10*3/uL (ref 0.7–3.1)
Lymphs: 37 %
MCH: 30.4 pg (ref 26.6–33.0)
MCHC: 32.4 g/dL (ref 31.5–35.7)
MCV: 94 fL (ref 79–97)
Monocytes Absolute: 0.3 10*3/uL (ref 0.1–0.9)
Monocytes: 6 %
Neutrophils Absolute: 3 10*3/uL (ref 1.4–7.0)
Neutrophils: 55 %
Platelets: 268 10*3/uL (ref 150–450)
RBC: 4.37 x10E6/uL (ref 3.77–5.28)
RDW: 12.2 % (ref 11.7–15.4)
WBC: 5.3 10*3/uL (ref 3.4–10.8)

## 2022-07-24 LAB — THYROID PANEL WITH TSH
Free Thyroxine Index: 2 (ref 1.2–4.9)
T3 Uptake Ratio: 25 % (ref 24–39)
T4, Total: 7.9 ug/dL (ref 4.5–12.0)
TSH: 3.48 u[IU]/mL (ref 0.450–4.500)

## 2022-07-24 LAB — LIPID PANEL
Chol/HDL Ratio: 4.3 ratio (ref 0.0–4.4)
Cholesterol, Total: 264 mg/dL — ABNORMAL HIGH (ref 100–199)
HDL: 62 mg/dL (ref >39)
LDL Chol Calc (NIH): 175 mg/dL — ABNORMAL HIGH (ref 0–99)
Triglycerides: 151 mg/dL — ABNORMAL HIGH (ref 0–149)
VLDL Cholesterol Cal: 27 mg/dL (ref 5–40)

## 2022-07-24 LAB — VITAMIN B12: Vitamin B-12: 624 pg/mL (ref 232–1245)

## 2022-07-24 LAB — VITAMIN D 25 HYDROXY (VIT D DEFICIENCY, FRACTURES): Vit D, 25-Hydroxy: 42.2 ng/mL (ref 30.0–100.0)

## 2022-07-26 ENCOUNTER — Other Ambulatory Visit: Payer: Self-pay | Admitting: Family Medicine

## 2022-07-26 DIAGNOSIS — R7989 Other specified abnormal findings of blood chemistry: Secondary | ICD-10-CM

## 2022-07-28 IMAGING — CT CT CARDIAC CORONARY ARTERY CALCIUM SCORE
3 series · 14 of 20 positions shown, 16 images · non-contrast
Comparison: None.
COMPARISON: None.

Addendum:
EXAM:
OVER-READ INTERPRETATION  CT CHEST

The following report is an over-read performed by radiologist Dr.
Aide Tat [REDACTED] on 02/16/2021. This over-read
does not include interpretation of cardiac or coronary anatomy or
pathology. The calcium score interpretation by the cardiologist is
attached.
CLINICAL DATA: Cardiovascular Disease Risk stratification
Coronary Calcium Score
TECHNIQUE: A gated, non-contrast computed tomography scan of the heart was
performed using 3mm slice thickness. Axial images were analyzed on a
dedicated workstation. Calcium scoring of the coronary arteries was
performed using the Agatston method.

[Series 2: cascseq 2.0 sa36 (id) (id) · axial · 0.39mm/px · z∈[-244,-154]mm · 4 of 76 slices shown]
[im 16/76  vessel]
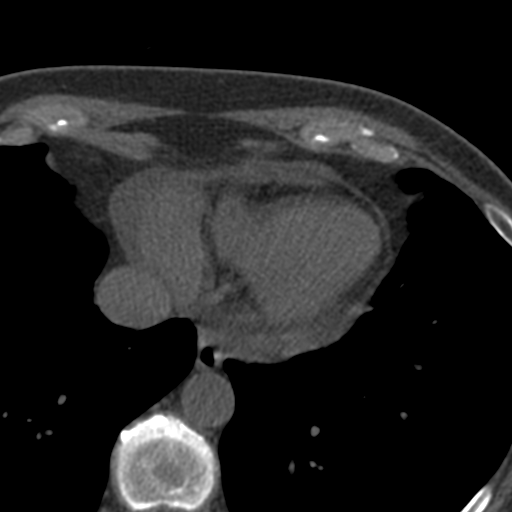
[im 31/76  vessel]
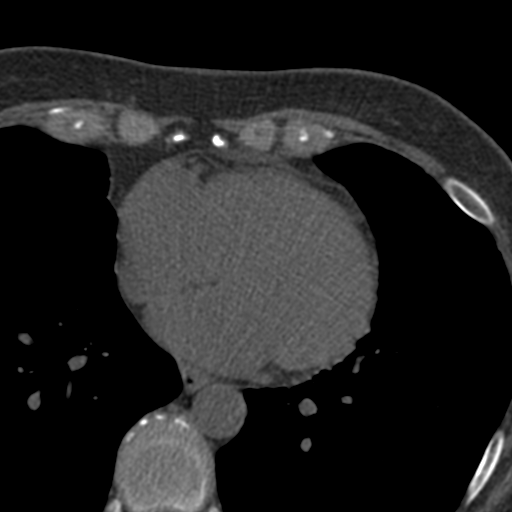
[im 46/76  vessel]
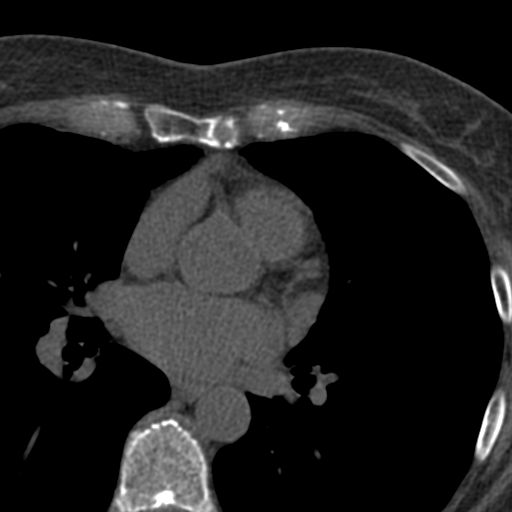
[im 61/76  vessel]
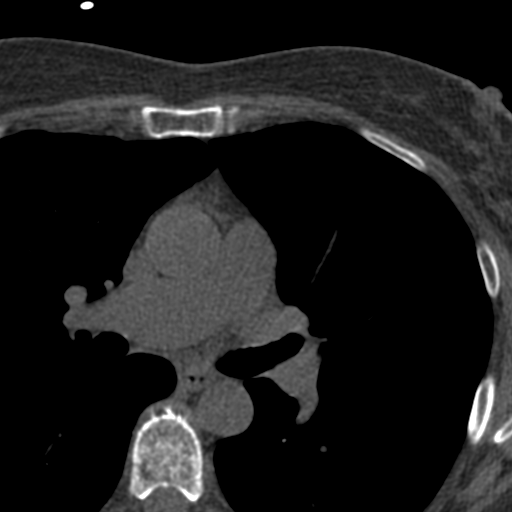

[Series 3: cascseq 2.0 bf37 st · axial · 0.71mm/px · z∈[-250,-150]mm · 5 of 76 slices shown, 7 images]
[im 13/76  vessel]
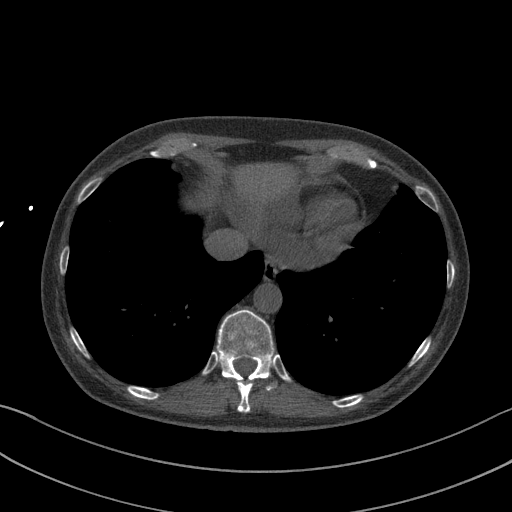
[im 13/76  lung]
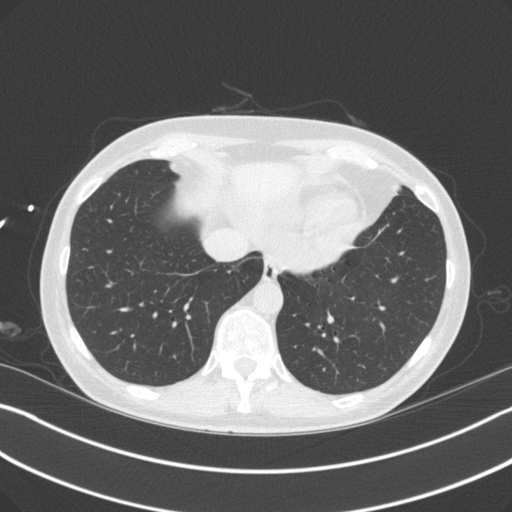
[im 26/76  vessel]
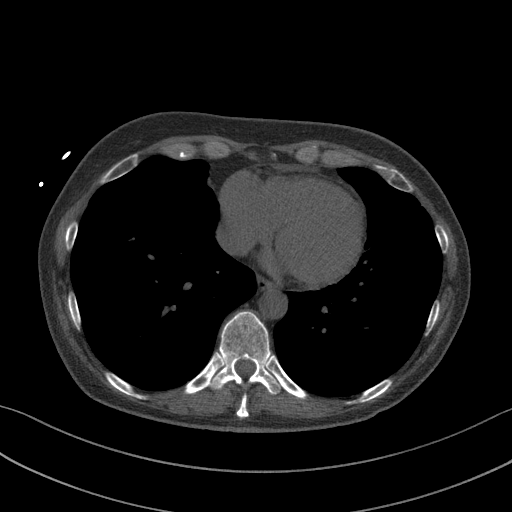
[im 38/76  vessel]
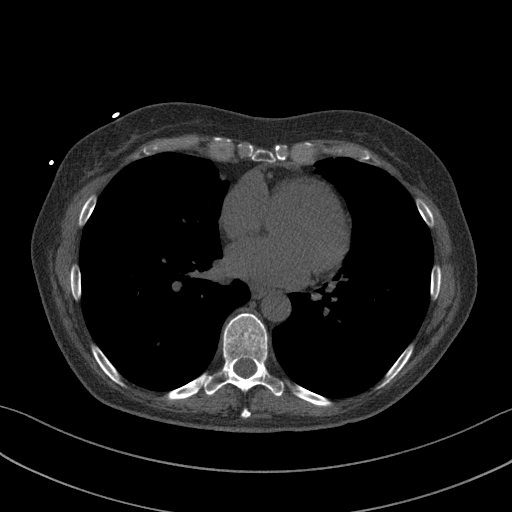
[im 51/76  vessel]
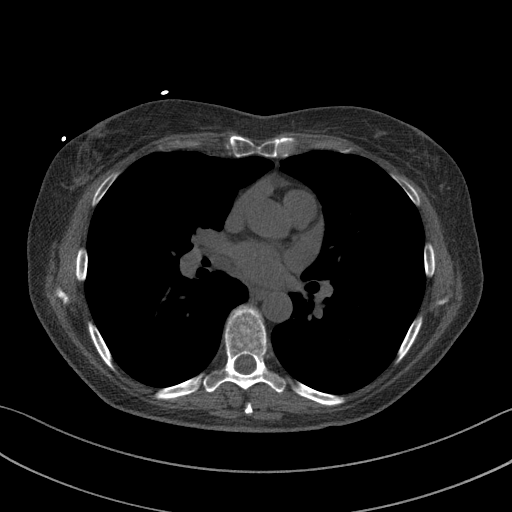
[im 63/76  vessel]
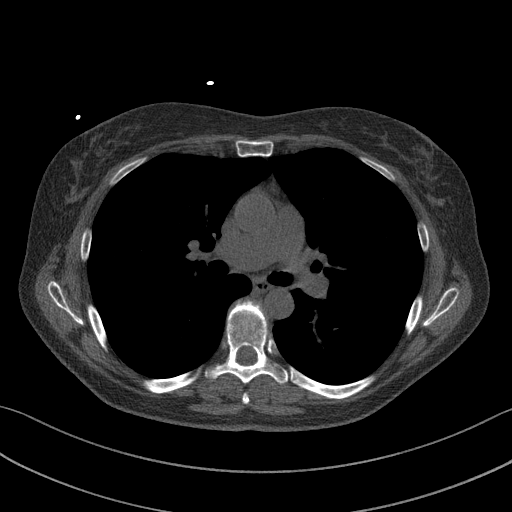
[im 63/76  lung]
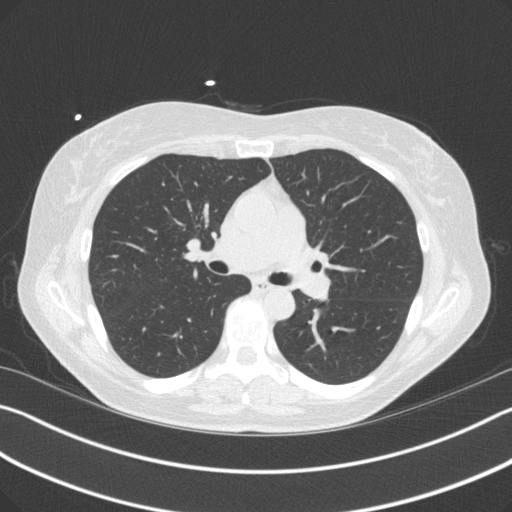

[Series 4: cascseq 2.0 br59 lung · axial · 0.71mm/px · z∈[-250,-150]mm · 5 of 76 slices shown]
[im 13/76  lung]
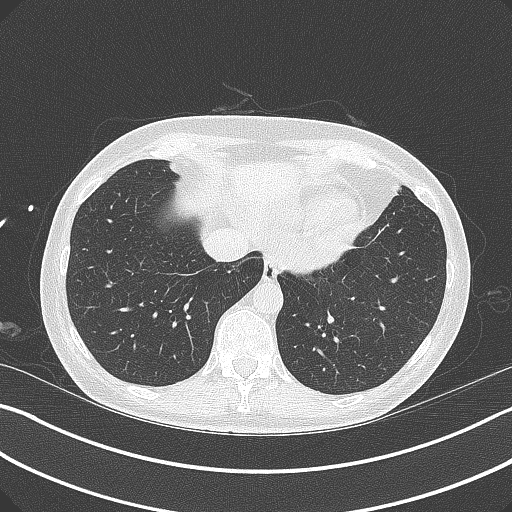
[im 26/76  lung]
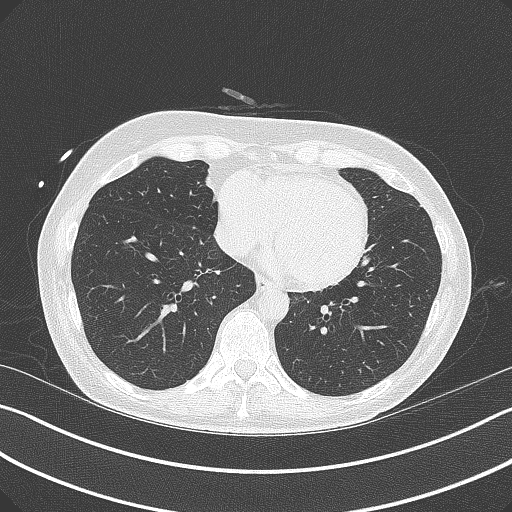
[im 38/76  lung]
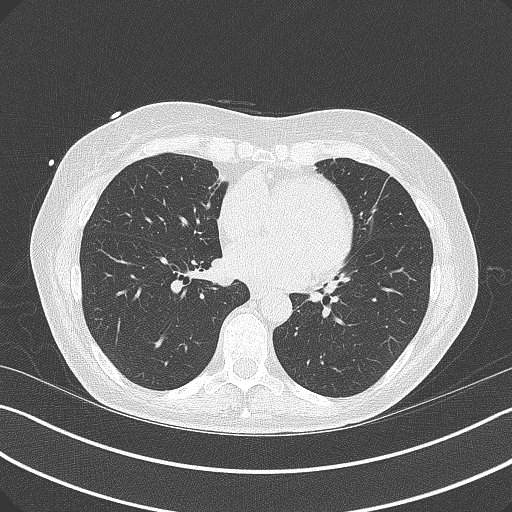
[im 51/76  lung]
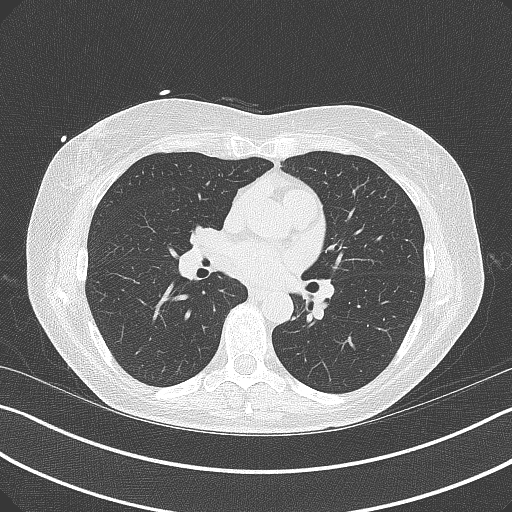
[im 63/76  lung]
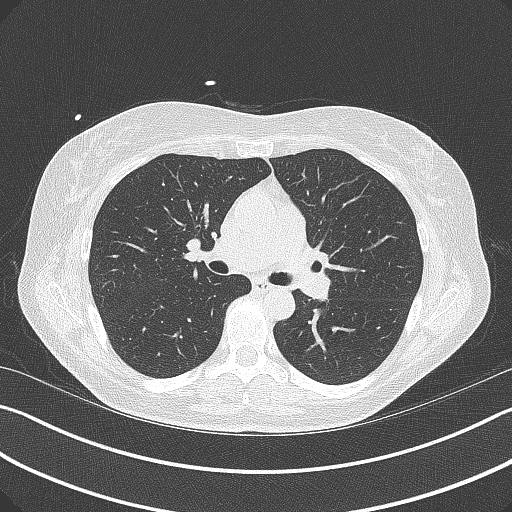

[14 of 20 positions shown; findings below may reference images not displayed]

FINDINGS: Vascular: Aortic atherosclerosis.

Mediastinum/Nodes: No imaged thoracic adenopathy.

Lungs/Pleura: No pleural fluid. 4 mm ground-glass nodule in the left
upper lobe on [DATE].

Upper Abdomen: Normal imaged portions of the liver, stomach.

Musculoskeletal: No acute osseous abnormality.
IMPRESSION: 1.  No acute findings in the imaged extracardiac chest.
2.  Aortic Atherosclerosis (P3GPB-K9L.L).
3. Left upper lobe 4 mm ground-glass nodule. Per consensus criteria,
this can be presumed benign and imaging follow-up is not indicated.
This recommendation follows the consensus statement: Guidelines for
Management of Incidental Pulmonary Nodules Detected on CT
Images:From the [HOSPITAL] 2184; published online before
print (10.1148/[id number]).
FINDINGS: Coronary arteries: Normal origins.

Coronary Calcium Score:

Left main: 0

Left anterior descending artery: 0

Left circumflex artery: 0

Right coronary artery: 0

Total: 0

Pericardium: Normal.

Ascending Aorta: Normal caliber.

Non-cardiac: See separate report from [REDACTED].
IMPRESSION: Coronary calcium score of 0 Agatston units. This suggests low risk
for future cardiac events.



If CAC=0, it is reasonable to withhold statin therapy and reassess
in 5 to 10 years, as long as higher risk conditions are absent
(diabetes mellitus, family history of premature CHD in first degree
relatives (males <55 years; females <65 years), cigarette smoking,
or LDL >=190 mg/dL).

If CAC is 1 to 99, it is reasonable to initiate statin therapy for
patients >=55 years of age.

If CAC is >=100 or >=75th percentile, it is reasonable to initiate
statin therapy at any age.

Cardiology referral should be considered for patients with CAC
scores >=400 or >=75th percentile.

*1103 AHA/ACC/AACVPR/AAPA/ABC/BENRABAH/DATABEX/HERMELIO/Errera/JUNE/SAINTIL/AYA AYOUTA
Guideline on the Management of Blood Cholesterol: A Report of the
American College of Cardiology/American Heart Association Task Force
on Clinical Practice Guidelines. J Am Coll Cardiol.
1271;73(24):3448-3203.

*** End of Addendum ***
EXAM:
OVER-READ INTERPRETATION  CT CHEST

The following report is an over-read performed by radiologist Dr.
Aide Tat [REDACTED] on 02/16/2021. This over-read
does not include interpretation of cardiac or coronary anatomy or
pathology. The calcium score interpretation by the cardiologist is
attached.
FINDINGS: Vascular: Aortic atherosclerosis.

Mediastinum/Nodes: No imaged thoracic adenopathy.

Lungs/Pleura: No pleural fluid. 4 mm ground-glass nodule in the left
upper lobe on [DATE].

Upper Abdomen: Normal imaged portions of the liver, stomach.

Musculoskeletal: No acute osseous abnormality.
IMPRESSION: 1.  No acute findings in the imaged extracardiac chest.
2.  Aortic Atherosclerosis (P3GPB-K9L.L).
3. Left upper lobe 4 mm ground-glass nodule. Per consensus criteria,
this can be presumed benign and imaging follow-up is not indicated.
This recommendation follows the consensus statement: Guidelines for
Management of Incidental Pulmonary Nodules Detected on CT
Images:From the [HOSPITAL] 2184; published online before
print (10.1148/[id number]).

## 2022-08-10 ENCOUNTER — Other Ambulatory Visit: Payer: Medicare HMO

## 2022-08-10 DIAGNOSIS — R7989 Other specified abnormal findings of blood chemistry: Secondary | ICD-10-CM

## 2022-08-10 DIAGNOSIS — H353211 Exudative age-related macular degeneration, right eye, with active choroidal neovascularization: Secondary | ICD-10-CM | POA: Diagnosis not present

## 2022-08-10 DIAGNOSIS — H43813 Vitreous degeneration, bilateral: Secondary | ICD-10-CM | POA: Diagnosis not present

## 2022-08-10 DIAGNOSIS — H35362 Drusen (degenerative) of macula, left eye: Secondary | ICD-10-CM | POA: Diagnosis not present

## 2022-08-11 LAB — CMP14+EGFR
ALT: 16 IU/L (ref 0–32)
AST: 23 IU/L (ref 0–40)
Albumin/Globulin Ratio: 2 (ref 1.2–2.2)
Albumin: 4.5 g/dL (ref 3.9–4.9)
Alkaline Phosphatase: 71 IU/L (ref 44–121)
BUN/Creatinine Ratio: 11 — ABNORMAL LOW (ref 12–28)
BUN: 10 mg/dL (ref 8–27)
Bilirubin Total: 0.4 mg/dL (ref 0.0–1.2)
CO2: 24 mmol/L (ref 20–29)
Calcium: 8.1 mg/dL — ABNORMAL LOW (ref 8.7–10.3)
Chloride: 99 mmol/L (ref 96–106)
Creatinine, Ser: 0.91 mg/dL (ref 0.57–1.00)
Globulin, Total: 2.3 g/dL (ref 1.5–4.5)
Glucose: 91 mg/dL (ref 70–99)
Potassium: 4.2 mmol/L (ref 3.5–5.2)
Sodium: 138 mmol/L (ref 134–144)
Total Protein: 6.8 g/dL (ref 6.0–8.5)
eGFR: 69 mL/min/{1.73_m2} (ref >59)

## 2022-09-21 DIAGNOSIS — H353211 Exudative age-related macular degeneration, right eye, with active choroidal neovascularization: Secondary | ICD-10-CM | POA: Diagnosis not present

## 2022-11-16 DIAGNOSIS — H353211 Exudative age-related macular degeneration, right eye, with active choroidal neovascularization: Secondary | ICD-10-CM | POA: Diagnosis not present

## 2022-12-07 ENCOUNTER — Encounter: Payer: Self-pay | Admitting: Family Medicine

## 2023-01-03 DIAGNOSIS — H353211 Exudative age-related macular degeneration, right eye, with active choroidal neovascularization: Secondary | ICD-10-CM | POA: Diagnosis not present

## 2023-01-03 DIAGNOSIS — H43813 Vitreous degeneration, bilateral: Secondary | ICD-10-CM | POA: Diagnosis not present

## 2023-01-03 DIAGNOSIS — H35362 Drusen (degenerative) of macula, left eye: Secondary | ICD-10-CM | POA: Diagnosis not present

## 2023-02-04 ENCOUNTER — Encounter: Payer: Self-pay | Admitting: *Deleted

## 2023-02-28 DIAGNOSIS — H353211 Exudative age-related macular degeneration, right eye, with active choroidal neovascularization: Secondary | ICD-10-CM | POA: Diagnosis not present

## 2023-05-09 DIAGNOSIS — H40021 Open angle with borderline findings, high risk, right eye: Secondary | ICD-10-CM | POA: Diagnosis not present

## 2023-05-09 DIAGNOSIS — H353121 Nonexudative age-related macular degeneration, left eye, early dry stage: Secondary | ICD-10-CM | POA: Diagnosis not present

## 2023-05-09 DIAGNOSIS — H43813 Vitreous degeneration, bilateral: Secondary | ICD-10-CM | POA: Diagnosis not present

## 2023-05-09 DIAGNOSIS — H353211 Exudative age-related macular degeneration, right eye, with active choroidal neovascularization: Secondary | ICD-10-CM | POA: Diagnosis not present

## 2023-05-09 DIAGNOSIS — H2511 Age-related nuclear cataract, right eye: Secondary | ICD-10-CM | POA: Diagnosis not present

## 2023-06-28 DIAGNOSIS — H43813 Vitreous degeneration, bilateral: Secondary | ICD-10-CM | POA: Diagnosis not present

## 2023-06-28 DIAGNOSIS — H35362 Drusen (degenerative) of macula, left eye: Secondary | ICD-10-CM | POA: Diagnosis not present

## 2023-06-28 DIAGNOSIS — Z961 Presence of intraocular lens: Secondary | ICD-10-CM | POA: Diagnosis not present

## 2023-06-28 DIAGNOSIS — H353211 Exudative age-related macular degeneration, right eye, with active choroidal neovascularization: Secondary | ICD-10-CM | POA: Diagnosis not present

## 2023-07-21 ENCOUNTER — Encounter: Payer: Self-pay | Admitting: Family Medicine

## 2023-07-21 ENCOUNTER — Ambulatory Visit: Payer: Medicare HMO | Admitting: Family Medicine

## 2023-07-21 VITALS — BP 133/74 | HR 72 | Temp 96.9°F | Ht 67.0 in | Wt 144.2 lb

## 2023-07-21 DIAGNOSIS — E785 Hyperlipidemia, unspecified: Secondary | ICD-10-CM | POA: Diagnosis not present

## 2023-07-21 DIAGNOSIS — Z130 Encounter for screening for diseases of the blood and blood-forming organs and certain disorders involving the immune mechanism: Secondary | ICD-10-CM

## 2023-07-21 DIAGNOSIS — Z0000 Encounter for general adult medical examination without abnormal findings: Secondary | ICD-10-CM

## 2023-07-21 DIAGNOSIS — E538 Deficiency of other specified B group vitamins: Secondary | ICD-10-CM

## 2023-07-21 DIAGNOSIS — H353211 Exudative age-related macular degeneration, right eye, with active choroidal neovascularization: Secondary | ICD-10-CM | POA: Diagnosis not present

## 2023-07-21 DIAGNOSIS — Z5320 Procedure and treatment not carried out because of patient's decision for unspecified reasons: Secondary | ICD-10-CM | POA: Diagnosis not present

## 2023-07-21 DIAGNOSIS — Z1329 Encounter for screening for other suspected endocrine disorder: Secondary | ICD-10-CM

## 2023-07-21 DIAGNOSIS — Z789 Other specified health status: Secondary | ICD-10-CM

## 2023-07-21 DIAGNOSIS — Z0001 Encounter for general adult medical examination with abnormal findings: Secondary | ICD-10-CM | POA: Diagnosis not present

## 2023-07-21 DIAGNOSIS — E559 Vitamin D deficiency, unspecified: Secondary | ICD-10-CM | POA: Diagnosis not present

## 2023-07-21 DIAGNOSIS — Z136 Encounter for screening for cardiovascular disorders: Secondary | ICD-10-CM

## 2023-07-21 NOTE — Progress Notes (Signed)
 Complete physical exam  Patient: Brittany Matthews   DOB: 1955/07/12   68 y.o. Female  MRN: [id number]  Subjective:     Chief Complaint  Patient presents with   Annual Exam    Brittany Matthews is a 68 y.o. female who presents today for a complete physical exam. She reports consuming a vegan diet. She is active daily. She generally feels well. She reports sleeping well. She does not have additional problems to discuss today.   Brittany Matthews is a 68 year old female who presents for an annual physical exam.  She has macular degeneration and continues to receive regular injections for this condition. There has been no recent worsening of her vision. She is compliant with her supplements and eye drops as prescribed.  No changes in hearing, bowel habits, or sleep patterns. No significant weight changes, numbness, tingling, or joint pain. She follows a vegan diet and takes vitamin D  and B12 supplements.  No respiratory symptoms such as cough, shortness of breath, or chest pain. No leg swelling or palpitations.       Most recent fall risk assessment:    07/21/2023    8:47 AM  Fall Risk   Falls in the past year? 0  Number falls in past yr: 0  Injury with Fall? 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   Dyslipidemia 03/18/2021   Exudative age-related macular degeneration of right eye with active choroidal neovascularization (HCC) 01/20/2021   Vegan diet 01/20/2021   Past Medical History:  Diagnosis Date   Cancer (HCC)    Skin   Macular degeneration disease    Past Surgical History:  Procedure  Laterality Date   CARPAL TUNNEL RELEASE     CESAREAN SECTION     Social History   Tobacco Use   Smoking status: Former    Current packs/day: 0.00    Types: Cigarettes    Quit date: 2009    Years since quitting: 16.3   Smokeless tobacco: Never   Tobacco comments:    Quit 20 years ago  Vaping Use   Vaping status: Never Used  Substance Use Topics   Drug use: Never   Social History   Socioeconomic History   Marital status: Married    Spouse name: Not on file   Number of children: Not on file   Years of education: Not on file   Highest education level: Not on file  Occupational History   Not on file  Tobacco Use   Smoking status: Former    Current packs/day: 0.00    Types: Cigarettes    Quit date: 2009    Years since quitting: 16.3   Smokeless tobacco: Never   Tobacco comments:    Quit 20 years ago  Vaping Use   Vaping status: Never Used  Substance and Sexual Activity   Alcohol use: Not on file   Drug use: Never   Sexual activity: Not on file  Other Topics Concern   Not on  file  Social History Narrative   Lives with husband   Social Drivers of Health   Financial Resource Strain: Not on file  Food Insecurity: Not on file  Transportation Needs: Not on file  Physical Activity: Not on file  Stress: Not on file  Social Connections: Unknown (07/28/2021)   Received from [facility], [facility]   Social Network    Social Network: Not on file  Intimate Partner Violence: Unknown (06/19/2021)   Received from [facility], [facility]   HITS    Physically Hurt: Not on file    Insult or Talk Down To: Not on file    Threaten Physical Harm: Not on file    Scream or Curse: Not on file   Family Status  Relation Name Status   Mother  Deceased   Father  Deceased   MGM  Deceased   MGF  Deceased   PGM  Deceased   PGF  Deceased  No partnership data on file   Family History  Problem Relation Age of Onset   COPD Mother    Parkinsonism Father    Allergies   Allergen Reactions   Penicillins Other (See Comments)   Sulfa Antibiotics Other (See Comments)      Patient Care Team: Galvin Jules, FNP as PCP - General (Family Medicine) Eilleen Grates, MD as PCP - Cardiology (Cardiology)   Outpatient Medications Prior to Visit  Medication Sig   Cyanocobalamin  (B-12 PO) Take by mouth once a week.   Multiple Vitamins-Minerals (ICAPS AREDS FORMULA PO) Take by mouth 2 (two) times daily.   Niacin (VITAMIN B-3 PO) Take by mouth 2 (two) times daily.   Ocular Implant (OCULAR RESERVOIR IZ) by Intravitreal route.   No facility-administered medications prior to visit.    ROS per HPI     Objective:     BP 133/74   Pulse 72   Temp (!) 96.9 F (36.1 C)   Ht 5\' 7"  (1.702 m)   Wt 144 lb 3.2 oz (65.4 kg)   SpO2 100%   BMI 22.58 kg/m  BP Readings from Last 3 Encounters:  07/21/23 133/74  07/23/22 122/76  07/21/21 110/68   Wt Readings from Last 3 Encounters:  07/21/23 144 lb 3.2 oz (65.4 kg)  07/23/22 147 lb (66.7 kg)  07/21/21 145 lb (65.8 kg)   SpO2 Readings from Last 3 Encounters:  07/21/23 100%  07/23/22 98%  07/21/21 95%      Physical Exam Vitals and nursing note reviewed.  Constitutional:      General: She is not in acute distress.    Appearance: Normal appearance. She is well-developed and well-groomed. She is not ill-appearing, toxic-appearing or diaphoretic.  HENT:     Head: Normocephalic and atraumatic.     Jaw: There is normal jaw occlusion.     Right Ear: Hearing, tympanic membrane, ear canal and external ear normal.     Left Ear: Hearing, tympanic membrane, ear canal and external ear normal.     Nose: Nose normal.     Mouth/Throat:     Lips: Pink.     Mouth: Mucous membranes are moist.     Pharynx: Oropharynx is clear. Uvula midline.  Eyes:     General: Lids are normal.     Extraocular Movements: Extraocular movements intact.     Conjunctiva/sclera: Conjunctivae normal.     Pupils: Pupils are equal, round, and  reactive to light.  Neck:     Thyroid : No thyroid  mass, thyromegaly  or thyroid  tenderness.     Vascular: No carotid bruit or JVD.     Trachea: Trachea and phonation normal.  Cardiovascular:     Rate and Rhythm: Normal rate and regular rhythm.     Chest Wall: PMI is not displaced.     Pulses: Normal pulses.     Heart sounds: Normal heart sounds. No murmur heard.    No friction rub. No gallop.  Pulmonary:     Effort: Pulmonary effort is normal. No respiratory distress.     Breath sounds: Normal breath sounds. No wheezing.  Abdominal:     General: Bowel sounds are normal. There is no distension or abdominal bruit.     Palpations: Abdomen is soft. There is no hepatomegaly or splenomegaly.     Tenderness: There is no abdominal tenderness. There is no right CVA tenderness or left CVA tenderness.     Hernia: No hernia is present.  Genitourinary:    Comments: Declined  Musculoskeletal:        General: Normal range of motion.     Cervical back: Normal range of motion and neck supple.     Right lower leg: No edema.     Left lower leg: No edema.  Lymphadenopathy:     Cervical: No cervical adenopathy.  Skin:    General: Skin is warm and dry.     Capillary Refill: Capillary refill takes less than 2 seconds.     Coloration: Skin is not cyanotic, jaundiced or pale.     Findings: No rash.  Neurological:     General: No focal deficit present.     Mental Status: She is alert and oriented to person, place, and time.     Sensory: Sensation is intact.     Motor: Motor function is intact.     Coordination: Coordination is intact.     Gait: Gait is intact.     Deep Tendon Reflexes: Reflexes are normal and symmetric.  Psychiatric:        Attention and Perception: Attention and perception normal.        Mood and Affect: Mood and affect normal.        Speech: Speech normal.        Behavior: Behavior normal. Behavior is cooperative.        Thought Content: Thought content normal.        Cognition  and Memory: Cognition and memory normal.        Judgment: Judgment normal.      Last CBC Lab Results  Component Value Date   WBC 5.3 07/23/2022   HGB 13.3 07/23/2022   HCT 41.1 07/23/2022   MCV 94 07/23/2022   MCH 30.4 07/23/2022   RDW 12.2 07/23/2022   PLT 268 07/23/2022   Last metabolic panel Lab Results  Component Value Date   GLUCOSE 91 08/10/2022   NA 138 08/10/2022   K 4.2 08/10/2022   CL 99 08/10/2022   CO2 24 08/10/2022   BUN 10 08/10/2022   CREATININE 0.91 08/10/2022   EGFR 69 08/10/2022   CALCIUM 8.1 (L) 08/10/2022   PROT 6.8 08/10/2022   ALBUMIN 4.5 08/10/2022   LABGLOB 2.3 08/10/2022   AGRATIO 2.0 08/10/2022   BILITOT 0.4 08/10/2022   ALKPHOS 71 08/10/2022   AST 23 08/10/2022   ALT 16 08/10/2022   Last lipids Lab Results  Component Value Date   CHOL 264 (H) 07/23/2022   HDL 62 07/23/2022   LDLCALC 175 (H) 07/23/2022  TRIG 151 (H) 07/23/2022   CHOLHDL 4.3 07/23/2022   Last hemoglobin A1c No results found for: "HGBA1C" Last thyroid  functions Lab Results  Component Value Date   TSH 3.480 07/23/2022   T4TOTAL 7.9 07/23/2022   Last vitamin D  Lab Results  Component Value Date   VD25OH 42.2 07/23/2022   Last vitamin B12 and Folate Lab Results  Component Value Date   VITAMINB12 624 07/23/2022        Assessment & Plan:    Routine Health Maintenance and Physical Exam  Immunization History  Administered Date(s) Administered   PFIZER(Purple Top)SARS-COV-2 Vaccination 05/30/2019, 06/20/2019    Health Maintenance  Topic Date Due   Medicare Annual Wellness (AWV)  Never done   Pneumonia Vaccine 35+ Years old (1 of 1 - PCV) 07/23/2023 (Originally 08/30/2003)   MAMMOGRAM  07/23/2023 (Originally 08/30/2003)   DEXA SCAN  07/23/2023 (Originally 08/30/2018)   Colonoscopy  07/23/2023 (Originally 08/30/1998)   COVID-19 Vaccine (3 - 2024-25 season) 08/06/2023 (Originally 11/14/2022)   Zoster Vaccines- Shingrix (1 of 2) 10/21/2023 (Originally 08/30/2003)    INFLUENZA VACCINE  10/14/2023   Hepatitis C Screening  Completed   HPV VACCINES  Aged Out   Meningococcal B Vaccine  Aged Out   DTaP/Tdap/Td  Discontinued    Discussed health benefits of physical activity, and encouraged her to engage in regular exercise appropriate for her age and condition.  Problem List Items Addressed This Visit       Cardiovascular and Mediastinum   Exudative age-related macular degeneration of right eye with active choroidal neovascularization (HCC)     Other   Vegan diet   Relevant Orders   CMP14+EGFR   Lipid panel   Thyroid  Panel With TSH   Vitamin D , 25-hydroxy   Anemia Profile B   Dyslipidemia   Relevant Orders   CMP14+EGFR   Lipid panel   Vitamin D  deficiency   Relevant Orders   Vitamin D , 25-hydroxy   Other Visit Diagnoses       Annual physical exam    -  Primary   Relevant Orders   CMP14+EGFR     Encounter for screening for cardiovascular disorders       Relevant Orders   CMP14+EGFR   Lipid panel     Screening for endocrine disorder       Relevant Orders   CMP14+EGFR   Thyroid  Panel With TSH     Screening for deficiency anemia       Relevant Orders   Anemia Profile B     Vitamin B12 deficiency       Relevant Orders   Anemia Profile B     Colon cancer screening declined         Screening mammography declined          Wellness Visit Routine wellness visit with no significant changes in health status. She declined mammogram, colonoscopy, DEXA scan, and vaccines. She maintains regular dental visits and follows a vegan diet. There is no significant family history of cancer; however, her father had Parkinson's disease and her mother had COPD. She has no smoking history. Labs will be updated to assess cholesterol, kidney function, thyroid , vitamin D , and B12 levels, including an anemia profile to evaluate iron and ferritin levels. Discussed potential treatment options for osteopenia or osteoporosis if DEXA scan indicates bone thinning,  including calcium, vitamin D , Fosamax, or Prolia. - Order lab work including cholesterol, kidney function, thyroid , vitamin D , and B12 levels - Include anemia profile in lab  work to assess iron and ferritin levels - Follow up in one year unless lab results indicate otherwise  Age-related macular degeneration Age-related macular degeneration is well-managed with regular injections and no worsening of symptoms.       Return in about 1 year (around 07/20/2024) for Annual Physical.     Kattie Parrot, FNP

## 2023-07-22 LAB — LIPID PANEL
Chol/HDL Ratio: 3.8 ratio (ref 0.0–4.4)
Cholesterol, Total: 253 mg/dL — ABNORMAL HIGH (ref 100–199)
HDL: 67 mg/dL (ref >39)
LDL Chol Calc (NIH): 168 mg/dL — ABNORMAL HIGH (ref 0–99)
Triglycerides: 106 mg/dL (ref 0–149)
VLDL Cholesterol Cal: 18 mg/dL (ref 5–40)

## 2023-07-22 LAB — THYROID PANEL WITH TSH
Free Thyroxine Index: 1.9 (ref 1.2–4.9)
T3 Uptake Ratio: 27 % (ref 24–39)
T4, Total: 7.1 ug/dL (ref 4.5–12.0)
TSH: 2.9 u[IU]/mL (ref 0.450–4.500)

## 2023-07-22 LAB — ANEMIA PROFILE B
Basophils Absolute: 0 10*3/uL (ref 0.0–0.2)
Basos: 0 %
EOS (ABSOLUTE): 0.1 10*3/uL (ref 0.0–0.4)
Eos: 1 %
Ferritin: 41 ng/mL (ref 15–150)
Folate: >20 ng/mL (ref >3.0)
Hematocrit: 37.6 % (ref 34.0–46.6)
Hemoglobin: 12.4 g/dL (ref 11.1–15.9)
Immature Grans (Abs): 0 10*3/uL (ref 0.0–0.1)
Immature Granulocytes: 0 %
Iron Saturation: 38 % (ref 15–55)
Iron: 120 ug/dL (ref 27–139)
Lymphocytes Absolute: 1.5 10*3/uL (ref 0.7–3.1)
Lymphs: 30 %
MCH: 30.3 pg (ref 26.6–33.0)
MCHC: 33 g/dL (ref 31.5–35.7)
MCV: 92 fL (ref 79–97)
Monocytes Absolute: 0.4 10*3/uL (ref 0.1–0.9)
Monocytes: 7 %
Neutrophils Absolute: 3.1 10*3/uL (ref 1.4–7.0)
Neutrophils: 62 %
Platelets: 281 10*3/uL (ref 150–450)
RBC: 4.09 x10E6/uL (ref 3.77–5.28)
RDW: 12.2 % (ref 11.7–15.4)
Retic Ct Pct: 0.9 % (ref 0.6–2.6)
Total Iron Binding Capacity: 315 ug/dL (ref 250–450)
UIBC: 195 ug/dL (ref 118–369)
Vitamin B-12: 662 pg/mL (ref 232–1245)
WBC: 5 10*3/uL (ref 3.4–10.8)

## 2023-07-22 LAB — VITAMIN D 25 HYDROXY (VIT D DEFICIENCY, FRACTURES): Vit D, 25-Hydroxy: 28.9 ng/mL — ABNORMAL LOW (ref 30.0–100.0)

## 2023-07-22 LAB — CMP14+EGFR
ALT: 15 IU/L (ref 0–32)
AST: 21 IU/L (ref 0–40)
Albumin: 4.2 g/dL (ref 3.9–4.9)
Alkaline Phosphatase: 71 IU/L (ref 44–121)
BUN/Creatinine Ratio: 14 (ref 12–28)
BUN: 13 mg/dL (ref 8–27)
Bilirubin Total: 0.5 mg/dL (ref 0.0–1.2)
CO2: 24 mmol/L (ref 20–29)
Calcium: 9.2 mg/dL (ref 8.7–10.3)
Chloride: 102 mmol/L (ref 96–106)
Creatinine, Ser: 0.92 mg/dL (ref 0.57–1.00)
Globulin, Total: 2.1 g/dL (ref 1.5–4.5)
Glucose: 86 mg/dL (ref 70–99)
Potassium: 4.3 mmol/L (ref 3.5–5.2)
Sodium: 140 mmol/L (ref 134–144)
Total Protein: 6.3 g/dL (ref 6.0–8.5)
eGFR: 67 mL/min/{1.73_m2} (ref >59)

## 2023-07-26 ENCOUNTER — Encounter: Payer: Medicare HMO | Admitting: Family Medicine

## 2023-08-01 ENCOUNTER — Encounter: Payer: Self-pay | Admitting: Family Medicine

## 2023-08-01 DIAGNOSIS — H353211 Exudative age-related macular degeneration, right eye, with active choroidal neovascularization: Secondary | ICD-10-CM | POA: Diagnosis not present

## 2023-08-11 DIAGNOSIS — H353131 Nonexudative age-related macular degeneration, bilateral, early dry stage: Secondary | ICD-10-CM | POA: Diagnosis not present

## 2023-08-11 DIAGNOSIS — H52223 Regular astigmatism, bilateral: Secondary | ICD-10-CM | POA: Diagnosis not present

## 2023-08-11 DIAGNOSIS — H5203 Hypermetropia, bilateral: Secondary | ICD-10-CM | POA: Diagnosis not present

## 2023-08-11 DIAGNOSIS — H524 Presbyopia: Secondary | ICD-10-CM | POA: Diagnosis not present

## 2023-08-11 DIAGNOSIS — H2513 Age-related nuclear cataract, bilateral: Secondary | ICD-10-CM | POA: Diagnosis not present

## 2023-09-05 DIAGNOSIS — H353211 Exudative age-related macular degeneration, right eye, with active choroidal neovascularization: Secondary | ICD-10-CM | POA: Diagnosis not present

## 2023-09-27 DIAGNOSIS — H53483 Generalized contraction of visual field, bilateral: Secondary | ICD-10-CM | POA: Diagnosis not present

## 2023-09-27 DIAGNOSIS — H02422 Myogenic ptosis of left eyelid: Secondary | ICD-10-CM | POA: Diagnosis not present

## 2023-09-27 DIAGNOSIS — H02831 Dermatochalasis of right upper eyelid: Secondary | ICD-10-CM | POA: Diagnosis not present

## 2023-09-27 DIAGNOSIS — H02421 Myogenic ptosis of right eyelid: Secondary | ICD-10-CM | POA: Diagnosis not present

## 2023-09-27 DIAGNOSIS — H02413 Mechanical ptosis of bilateral eyelids: Secondary | ICD-10-CM | POA: Diagnosis not present

## 2023-09-27 DIAGNOSIS — H02423 Myogenic ptosis of bilateral eyelids: Secondary | ICD-10-CM | POA: Diagnosis not present

## 2023-09-27 DIAGNOSIS — H57813 Brow ptosis, bilateral: Secondary | ICD-10-CM | POA: Diagnosis not present

## 2023-09-27 DIAGNOSIS — Z01818 Encounter for other preprocedural examination: Secondary | ICD-10-CM | POA: Diagnosis not present

## 2023-09-27 DIAGNOSIS — H02834 Dermatochalasis of left upper eyelid: Secondary | ICD-10-CM | POA: Diagnosis not present

## 2023-09-27 DIAGNOSIS — H0279 Other degenerative disorders of eyelid and periocular area: Secondary | ICD-10-CM | POA: Diagnosis not present

## 2023-09-27 DIAGNOSIS — H02412 Mechanical ptosis of left eyelid: Secondary | ICD-10-CM | POA: Diagnosis not present

## 2023-09-27 DIAGNOSIS — H02411 Mechanical ptosis of right eyelid: Secondary | ICD-10-CM | POA: Diagnosis not present

## 2023-11-15 DIAGNOSIS — H35362 Drusen (degenerative) of macula, left eye: Secondary | ICD-10-CM | POA: Diagnosis not present

## 2023-11-15 DIAGNOSIS — H35453 Secondary pigmentary degeneration, bilateral: Secondary | ICD-10-CM | POA: Diagnosis not present

## 2023-11-15 DIAGNOSIS — H353211 Exudative age-related macular degeneration, right eye, with active choroidal neovascularization: Secondary | ICD-10-CM | POA: Diagnosis not present

## 2023-11-15 DIAGNOSIS — Z961 Presence of intraocular lens: Secondary | ICD-10-CM | POA: Diagnosis not present

## 2023-11-15 DIAGNOSIS — H43813 Vitreous degeneration, bilateral: Secondary | ICD-10-CM | POA: Diagnosis not present

## 2024-01-05 DIAGNOSIS — H02411 Mechanical ptosis of right eyelid: Secondary | ICD-10-CM | POA: Diagnosis not present

## 2024-01-05 DIAGNOSIS — H0279 Other degenerative disorders of eyelid and periocular area: Secondary | ICD-10-CM | POA: Diagnosis not present

## 2024-01-05 DIAGNOSIS — H02412 Mechanical ptosis of left eyelid: Secondary | ICD-10-CM | POA: Diagnosis not present

## 2024-01-05 DIAGNOSIS — H02422 Myogenic ptosis of left eyelid: Secondary | ICD-10-CM | POA: Diagnosis not present

## 2024-01-05 DIAGNOSIS — H02834 Dermatochalasis of left upper eyelid: Secondary | ICD-10-CM | POA: Diagnosis not present

## 2024-01-05 DIAGNOSIS — H53483 Generalized contraction of visual field, bilateral: Secondary | ICD-10-CM | POA: Diagnosis not present

## 2024-01-05 DIAGNOSIS — H02421 Myogenic ptosis of right eyelid: Secondary | ICD-10-CM | POA: Diagnosis not present

## 2024-01-05 DIAGNOSIS — H02423 Myogenic ptosis of bilateral eyelids: Secondary | ICD-10-CM | POA: Diagnosis not present

## 2024-01-05 DIAGNOSIS — H02413 Mechanical ptosis of bilateral eyelids: Secondary | ICD-10-CM | POA: Diagnosis not present

## 2024-01-05 DIAGNOSIS — H57813 Brow ptosis, bilateral: Secondary | ICD-10-CM | POA: Diagnosis not present

## 2024-01-05 DIAGNOSIS — H02831 Dermatochalasis of right upper eyelid: Secondary | ICD-10-CM | POA: Diagnosis not present

## 2024-01-17 DIAGNOSIS — H353211 Exudative age-related macular degeneration, right eye, with active choroidal neovascularization: Secondary | ICD-10-CM | POA: Diagnosis not present

## 2024-01-31 DIAGNOSIS — H353211 Exudative age-related macular degeneration, right eye, with active choroidal neovascularization: Secondary | ICD-10-CM | POA: Diagnosis not present

## 2024-02-28 DIAGNOSIS — H353211 Exudative age-related macular degeneration, right eye, with active choroidal neovascularization: Secondary | ICD-10-CM | POA: Diagnosis not present

## 2024-04-04 NOTE — Progress Notes (Addendum)
 Brittany Matthews                                          MRN: [id number]   04/04/2024   The [facility] Quality Team Specialist reviewed this patient medical record for the purposes of chart review for care gap closure. The following were reviewed: chart review for care gap closure-breast cancer screening and colorectal cancer screening.    [facility] Quality Team

## 2024-07-24 ENCOUNTER — Encounter: Payer: Self-pay | Admitting: Family Medicine
